# Patient Record
Sex: Female | Born: 1980 | Hispanic: Yes | Marital: Single | Smoking: Never smoker
Health system: Southern US, Community
[De-identification: ages and names within clinical notes are randomized; demographics above are authoritative.]

## PROBLEM LIST (undated history)

## (undated) HISTORY — PX: ANKLE SURGERY: SHX546

## (undated) HISTORY — PX: OTHER SURGICAL HISTORY: SHX169

---
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
## 2001-07-31 ENCOUNTER — Inpatient Hospital Stay (HOSPITAL_COMMUNITY): Admission: AD | Admit: 2001-07-31 | Discharge: 2001-08-02 | Payer: Self-pay | Admitting: Obstetrics

## 2008-09-05 ENCOUNTER — Inpatient Hospital Stay: Payer: Self-pay

## 2015-01-30 LAB — HM PAP SMEAR: HM Pap smear: NEGATIVE

## 2015-05-24 LAB — HM HIV SCREENING LAB: HM HIV Screening: NEGATIVE

## 2017-01-12 ENCOUNTER — Encounter: Payer: Self-pay | Admitting: Emergency Medicine

## 2017-01-12 ENCOUNTER — Emergency Department
Admission: EM | Admit: 2017-01-12 | Discharge: 2017-01-12 | Disposition: A | Discharge status: Home / Self Care | Payer: Self-pay | Attending: Emergency Medicine | Admitting: Emergency Medicine

## 2017-01-12 ENCOUNTER — Emergency Department: Payer: Self-pay

## 2017-01-12 DIAGNOSIS — M25531 Pain in right wrist: Secondary | ICD-10-CM | POA: Insufficient documentation

## 2017-01-12 DIAGNOSIS — M79641 Pain in right hand: Secondary | ICD-10-CM

## 2017-01-12 DIAGNOSIS — Y939 Activity, unspecified: Secondary | ICD-10-CM | POA: Insufficient documentation

## 2017-01-12 DIAGNOSIS — R531 Weakness: Secondary | ICD-10-CM | POA: Insufficient documentation

## 2017-01-12 DIAGNOSIS — W208XXA Other cause of strike by thrown, projected or falling object, initial encounter: Secondary | ICD-10-CM | POA: Insufficient documentation

## 2017-01-12 DIAGNOSIS — Y990 Civilian activity done for income or pay: Secondary | ICD-10-CM | POA: Insufficient documentation

## 2017-01-12 DIAGNOSIS — Y929 Unspecified place or not applicable: Secondary | ICD-10-CM | POA: Insufficient documentation

## 2017-01-12 MED ORDER — MELOXICAM 7.5 MG PO TABS: 7.5000 mg | ORAL_TABLET | Freq: Every day | ORAL | 1 refills | Status: AC

## 2017-01-12 NOTE — ED Triage Notes (Signed)
Last Wednesday while at work, one of the pots that patient was washing hit right wrist. C/O right wrist pain and right hand feeling weaker.
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
## 2017-05-14 ENCOUNTER — Encounter: Payer: Self-pay | Admitting: Emergency Medicine

## 2017-05-14 ENCOUNTER — Emergency Department
Admission: EM | Admit: 2017-05-14 | Discharge: 2017-05-14 | Disposition: A | Discharge status: Home / Self Care | Payer: Self-pay | Attending: Emergency Medicine | Admitting: Emergency Medicine

## 2017-05-14 DIAGNOSIS — M436 Torticollis: Secondary | ICD-10-CM | POA: Insufficient documentation

## 2017-05-14 MED ORDER — MELOXICAM 15 MG PO TABS: 15.0000 mg | ORAL_TABLET | Freq: Every day | ORAL | 0 refills | Status: DC

## 2017-05-14 MED ORDER — CYCLOBENZAPRINE HCL 10 MG PO TABS: 10.0000 mg | ORAL_TABLET | Freq: Three times a day (TID) | ORAL | 0 refills | Status: DC | PRN

## 2017-05-14 NOTE — ED Triage Notes (Signed)
Pt reports turned head last night heard a pop and now has left side neck pain. Pt ambulatory to triage. Pt alert and oriented.

## 2017-05-14 NOTE — ED Provider Notes (Signed)
[facility] Emergency Department Provider Note  ____________________________________________  Time seen: Approximately 4:37 PM  I have reviewed the triage vital signs and the nursing notes.   HISTORY  Chief Complaint Neck Pain    HPI Alejandra Coleman is a 36 y.o. female who presents emergency department complaining of left-sided neck pain. Patient reports that she believes she slept wrong. When she woke up this morning, she felt a "pop" to the left side of her neck and has had neck stiffness and pain to the lateral left neck since. She denies any radicular symptoms. No trauma. No headache or visual changes, chest pain, shortness of breath, abdominal pain, nausea or vomiting. No other complaints at this time.No medications prior to arrival.   History reviewed. No pertinent past medical history.  There are no active problems to display for this patient.   No past surgical history on file.  Prior to Admission medications   Medication Sig Start Date End Date Taking? Authorizing Provider  cyclobenzaprine (FLEXERIL) 10 MG tablet Take 1 tablet (10 mg total) by mouth 3 (three) times daily as needed for muscle spasms. 05/14/17   Nahla Lukin, Delorise Royals, PA-C  meloxicam (MOBIC) 15 MG tablet Take 1 tablet (15 mg total) by mouth daily. 05/14/17   Trimaine Maser, Delorise Royals, PA-C    Allergies Patient has no known allergies.  No family history on file.  Social History Social History  Substance Use Topics  . Smoking status: Not on file  . Smokeless tobacco: Not on file  . Alcohol use Not on file     Review of Systems  Constitutional: No fever/chills Eyes: No visual changes. No discharge ENT: No upper respiratory complaints. Cardiovascular: no chest pain. Respiratory: no cough. No SOB. Gastrointestinal: No abdominal pain.  No nausea, no vomiting. Musculoskeletal: Positive for left-sided neck pain Skin: Negative for rash, abrasions, lacerations,  ecchymosis. Neurological: Negative for headaches, focal weakness or numbness. 10-point ROS otherwise negative.  ____________________________________________   PHYSICAL EXAM:  VITAL SIGNS: ED Triage Vitals  Enc Vitals Group     BP 05/14/17 1601 115/68     Pulse Rate 05/14/17 1601 73     Resp 05/14/17 1601 18     Temp 05/14/17 1601 98.9 F (37.2 C)     Temp Source 05/14/17 1601 Oral     SpO2 05/14/17 1601 97 %     Weight 05/14/17 1559 138 lb (62.6 kg)     Height --      Head Circumference --      Peak Flow --      Pain Score 05/14/17 1559 8     Pain Loc --      Pain Edu? --      Excl. in GC? --      Constitutional: Alert and oriented. Well appearing and in no acute distress. Eyes: Conjunctivae are normal. PERRL. EOMI. Head: Atraumatic. ENT:      Ears:       Nose: No congestion/rhinnorhea.      Mouth/Throat: Mucous membranes are moist.  Neck: No stridor.  No midline cervical spine tenderness to palpation. Mild tenderness to palpation of the trapezius muscle with spasms appreciated. No other tenderness to palpation. Radial pulse intact bilateral upper extremity. Sensation intact and equal all dermatomal distributions bilateral upper extremities.  Cardiovascular: Normal rate, regular rhythm. Normal S1 and S2.  Good peripheral circulation. Respiratory: Normal respiratory effort without tachypnea or retractions. Lungs CTAB. Good air entry to the bases with no decreased or absent breath  sounds. Musculoskeletal: Full range of motion to all extremities. No gross deformities appreciated. Neurologic:  Normal speech and language. No gross focal neurologic deficits are appreciated.  Skin:  Skin is warm, dry and intact. No rash noted. Psychiatric: Mood and affect are normal. Speech and behavior are normal. Patient exhibits appropriate insight and judgement.   ____________________________________________   LABS (all labs ordered are listed, but only abnormal results are  displayed)  Labs Reviewed - No data to display ____________________________________________  EKG   ____________________________________________  RADIOLOGY   No results found.  ____________________________________________    PROCEDURES  Procedure(s) performed:    Procedures    Medications - No data to display   ____________________________________________   INITIAL IMPRESSION / ASSESSMENT AND PLAN / ED COURSE  Pertinent labs & imaging results that were available during my care of the patient were reviewed by me and considered in my medical decision making (see chart for details).  Review of the [place] CSRS was performed in accordance of the [facility] prior to dispensing any controlled drugs.     Patient's diagnosis is consistent with torticollis. History, physical exam are consistent with diagnosis of torticollis. At this time, no indication for labs or imaging. Patient will be discharged home with prescriptions for anti-inflammatory muscle relaxer. Patient is to follow up with primary care as needed or otherwise directed. Patient is given ED precautions to return to the ED for any worsening or new symptoms.     ____________________________________________  FINAL CLINICAL IMPRESSION(S) / ED DIAGNOSES  Final diagnoses:  Torticollis      NEW MEDICATIONS STARTED DURING THIS VISIT:  New Prescriptions   CYCLOBENZAPRINE (FLEXERIL) 10 MG TABLET    Take 1 tablet (10 mg total) by mouth 3 (three) times daily as needed for muscle spasms.   MELOXICAM (MOBIC) 15 MG TABLET    Take 1 tablet (15 mg total) by mouth daily.        This chart was dictated using voice recognition software/Dragon. Despite best efforts to proofread, errors can occur which can change the meaning. Any change was purely unintentional.    Racheal PatchesCuthriell, Eulon Allnutt D, PA-C 05/14/17 1642    Rockne MenghiniNorman, Anne-Caroline, MD 05/14/17 [phone]

## 2017-05-14 NOTE — ED Notes (Signed)
See triage note  Presents with pain to neck  States she woke up with pain yesterday  Felt a pop when turning her neck  Denies any trauma or fever

## 2017-06-14 ENCOUNTER — Encounter: Payer: Self-pay | Admitting: Emergency Medicine

## 2017-07-17 ENCOUNTER — Encounter (HOSPITAL_COMMUNITY): Payer: Self-pay

## 2017-07-17 DIAGNOSIS — Z5321 Procedure and treatment not carried out due to patient leaving prior to being seen by health care provider: Secondary | ICD-10-CM | POA: Insufficient documentation

## 2017-07-17 DIAGNOSIS — R109 Unspecified abdominal pain: Secondary | ICD-10-CM | POA: Insufficient documentation

## 2017-07-17 LAB — URINALYSIS, ROUTINE W REFLEX MICROSCOPIC
BILIRUBIN URINE: NEGATIVE
GLUCOSE, UA: NEGATIVE mg/dL
Hgb urine dipstick: NEGATIVE
KETONES UR: NEGATIVE mg/dL
Nitrite: NEGATIVE
PH: 6 (ref 5.0–8.0)
Protein, ur: 30 mg/dL — AB
Specific Gravity, Urine: 1.025 (ref 1.005–1.030)

## 2017-07-17 LAB — COMPREHENSIVE METABOLIC PANEL
ALT: 29 U/L (ref 14–54)
AST: 24 U/L (ref 15–41)
Albumin: 4.1 g/dL (ref 3.5–5.0)
Alkaline Phosphatase: 59 U/L (ref 38–126)
Anion gap: 8 (ref 5–15)
BUN: 12 mg/dL (ref 6–20)
CALCIUM: 9 mg/dL (ref 8.9–10.3)
CO2: 21 mmol/L — ABNORMAL LOW (ref 22–32)
CREATININE: 0.72 mg/dL (ref 0.44–1.00)
Chloride: 105 mmol/L (ref 101–111)
GFR calc non Af Amer: >60 mL/min (ref >60)
Glucose, Bld: 111 mg/dL — ABNORMAL HIGH (ref 65–99)
Potassium: 3.8 mmol/L (ref 3.5–5.1)
Sodium: 134 mmol/L — ABNORMAL LOW (ref 135–145)
Total Bilirubin: 0.7 mg/dL (ref 0.3–1.2)
Total Protein: 6.9 g/dL (ref 6.5–8.1)

## 2017-07-17 LAB — CBC
HCT: 39.7 % (ref 36.0–46.0)
Hemoglobin: 14.1 g/dL (ref 12.0–15.0)
MCH: 31.3 pg (ref 26.0–34.0)
MCHC: 35.5 g/dL (ref 30.0–36.0)
MCV: 88.2 fL (ref 78.0–100.0)
PLATELETS: 240 10*3/uL (ref 150–400)
RBC: 4.5 MIL/uL (ref 3.87–5.11)
RDW: 12.3 % (ref 11.5–15.5)
WBC: 9.6 10*3/uL (ref 4.0–10.5)

## 2017-07-17 LAB — LIPASE, BLOOD: LIPASE: 25 U/L (ref 11–51)

## 2017-07-17 LAB — I-STAT BETA HCG BLOOD, ED (MC, WL, AP ONLY): I-stat hCG, quantitative: 15.8 m[IU]/mL — ABNORMAL HIGH (ref <5)

## 2017-07-17 NOTE — ED Triage Notes (Signed)
Pt endorses Left sided abd pain x 1 week with nausea. Pt states that she thinks she may be pregnant because her menstrual cycle hasn't started yet. Also endorsing hematuria. Denies vomiting or diarrhea. VSS.

## 2017-07-18 ENCOUNTER — Emergency Department (HOSPITAL_COMMUNITY): Payer: Self-pay

## 2017-07-18 ENCOUNTER — Emergency Department (HOSPITAL_COMMUNITY)
Admission: EM | Admit: 2017-07-18 | Discharge: 2017-07-18 | Disposition: A | Discharge status: Home / Self Care | Payer: Self-pay | Attending: Emergency Medicine | Admitting: Emergency Medicine

## 2017-07-18 ENCOUNTER — Encounter (HOSPITAL_COMMUNITY): Payer: Self-pay | Admitting: Emergency Medicine

## 2017-07-18 DIAGNOSIS — Z79899 Other long term (current) drug therapy: Secondary | ICD-10-CM | POA: Insufficient documentation

## 2017-07-18 DIAGNOSIS — K5900 Constipation, unspecified: Secondary | ICD-10-CM | POA: Insufficient documentation

## 2017-07-18 DIAGNOSIS — F17210 Nicotine dependence, cigarettes, uncomplicated: Secondary | ICD-10-CM | POA: Insufficient documentation

## 2017-07-18 DIAGNOSIS — F17228 Nicotine dependence, chewing tobacco, with other nicotine-induced disorders: Secondary | ICD-10-CM | POA: Insufficient documentation

## 2017-07-18 DIAGNOSIS — R109 Unspecified abdominal pain: Secondary | ICD-10-CM | POA: Insufficient documentation

## 2017-07-18 LAB — COMPREHENSIVE METABOLIC PANEL
ALK PHOS: 52 U/L (ref 38–126)
ALT: 25 U/L (ref 14–54)
AST: 16 U/L (ref 15–41)
Albumin: 3.8 g/dL (ref 3.5–5.0)
Anion gap: 6 (ref 5–15)
BUN: 9 mg/dL (ref 6–20)
CALCIUM: 8.6 mg/dL — AB (ref 8.9–10.3)
CO2: 23 mmol/L (ref 22–32)
CREATININE: 0.52 mg/dL (ref 0.44–1.00)
Chloride: 108 mmol/L (ref 101–111)
Glucose, Bld: 95 mg/dL (ref 65–99)
Potassium: 3.8 mmol/L (ref 3.5–5.1)
Sodium: 137 mmol/L (ref 135–145)
Total Bilirubin: 0.8 mg/dL (ref 0.3–1.2)
Total Protein: 6.6 g/dL (ref 6.5–8.1)

## 2017-07-18 LAB — POC URINE PREG, ED: Preg Test, Ur: NEGATIVE

## 2017-07-18 LAB — CBC WITH DIFFERENTIAL/PLATELET
BASOS ABS: 0 10*3/uL (ref 0.0–0.1)
Basophils Relative: 0 %
Eosinophils Absolute: 0.1 10*3/uL (ref 0.0–0.7)
Eosinophils Relative: 1 %
HEMATOCRIT: 38.9 % (ref 36.0–46.0)
Hemoglobin: 13.3 g/dL (ref 12.0–15.0)
LYMPHS PCT: 36 %
Lymphs Abs: 2.5 10*3/uL (ref 0.7–4.0)
MCH: 30.6 pg (ref 26.0–34.0)
MCHC: 34.2 g/dL (ref 30.0–36.0)
MCV: 89.4 fL (ref 78.0–100.0)
MONOS PCT: 5 %
Monocytes Absolute: 0.3 10*3/uL (ref 0.1–1.0)
Neutro Abs: 3.9 10*3/uL (ref 1.7–7.7)
Neutrophils Relative %: 58 %
Platelets: 216 10*3/uL (ref 150–400)
RBC: 4.35 MIL/uL (ref 3.87–5.11)
RDW: 12.4 % (ref 11.5–15.5)
WBC: 6.8 10*3/uL (ref 4.0–10.5)

## 2017-07-18 LAB — URINALYSIS, ROUTINE W REFLEX MICROSCOPIC
Bilirubin Urine: NEGATIVE
GLUCOSE, UA: NEGATIVE mg/dL
Hgb urine dipstick: NEGATIVE
Ketones, ur: NEGATIVE mg/dL
NITRITE: NEGATIVE
Protein, ur: NEGATIVE mg/dL
SPECIFIC GRAVITY, URINE: 1.018 (ref 1.005–1.030)
pH: 7 (ref 5.0–8.0)

## 2017-07-18 LAB — LIPASE, BLOOD: LIPASE: 22 U/L (ref 11–51)

## 2017-07-18 MED ORDER — POLYETHYLENE GLYCOL 3350 17 G PO PACK: 17.0000 g | PACK | Freq: Every day | ORAL | 0 refills | Status: DC

## 2017-07-18 MED ORDER — OMEPRAZOLE 20 MG PO CPDR: 20.0000 mg | DELAYED_RELEASE_CAPSULE | Freq: Every day | ORAL | 0 refills | Status: DC

## 2017-07-18 NOTE — ED Provider Notes (Signed)
[facility] EMERGENCY DEPARTMENT Provider Note   CSN: [id number] Arrival date & time: 07/18/17  1246     History   Chief Complaint Chief Complaint  Patient presents with  . Abdominal Pain  . Hematuria    HPI Alejandra Coleman is a 36 y.o. female.  HPI Patient presents with abdominal pain.  Has had it for the last week.  It is in her left upper abdomen.  No vomiting.  Has had some constipation.  No fevers or chills.  Has not really pains like this before.  She speaks Spanish primarily and translator service was used.  No blood in the stool.  Pain does not appear to associate with eating.  It has come and gone.  States she has at times had blood in her urine also. History reviewed. No pertinent past medical history.  There are no active problems to display for this patient.   Past Surgical History:  Procedure Laterality Date  . ANKLE SURGERY      OB History    No data available       Home Medications    Prior to Admission medications   Medication Sig Start Date End Date Taking? Authorizing Provider  ibuprofen (ADVIL,MOTRIN) 200 MG tablet Take 400 mg by mouth every 6 (six) hours as needed for moderate pain.   Yes [provider]  cyclobenzaprine (FLEXERIL) 10 MG tablet Take 1 tablet (10 mg total) by mouth 3 (three) times daily as needed for muscle spasms. Patient not taking: Reported on 07/18/2017 05/14/17   Cuthriell, Delorise Royals, PA-C  meloxicam (MOBIC) 15 MG tablet Take 1 tablet (15 mg total) by mouth daily. Patient not taking: Reported on 07/18/2017 05/14/17   Cuthriell, Delorise Royals, PA-C  omeprazole (PRILOSEC) 20 MG capsule Take 1 capsule (20 mg total) by mouth daily. 07/18/17   Benjiman Core, MD  polyethylene glycol [facility] / GLYCOLAX) packet Take 17 g by mouth daily. 07/18/17   Benjiman Core, MD    Family History No family history on file.  Social History Social History  Substance Use Topics  . Smoking status: Current  Some Day Smoker    Types: Cigarettes  . Smokeless tobacco: Current User  . Alcohol use Yes     Comment: occ     Allergies   Patient has no known allergies.   Review of Systems Review of Systems  Constitutional: Negative for appetite change.  HENT: Negative for congestion.   Respiratory: Negative for shortness of breath.   Cardiovascular: Negative for chest pain.  Gastrointestinal: Positive for abdominal pain and constipation.  Genitourinary: Positive for hematuria. Negative for flank pain.  Musculoskeletal: Negative for back pain.  Skin: Negative for rash.  Neurological: Negative for numbness.  Psychiatric/Behavioral: Negative for confusion.     Physical Exam Updated Vital Signs BP 131/81 (BP Location: Right Arm)   Pulse 89   Temp 98.4 F (36.9 C) (Oral)   Resp 18   Ht 5\' 3"  (1.6 m)   Wt 63.5 kg (140 lb)   LMP 06/09/2017 Comment: Neg Preg Test  SpO2 98%   BMI 24.80 kg/m   Physical Exam  Constitutional: She appears well-developed.  HENT:  Head: Atraumatic.  Eyes: EOM are normal.  Neck: Neck supple.  Cardiovascular: Normal rate.   Pulmonary/Chest: Effort normal.  Abdominal: There is tenderness.  Mild left upper quadrant tenderness without rebound or guarding.  Musculoskeletal: She exhibits no edema.  Neurological: She is alert.  Skin: Skin is warm. Capillary  refill takes less than 2 seconds.  Psychiatric: She has a normal mood and affect.     ED Treatments / Results  Labs (all labs ordered are listed, but only abnormal results are displayed) Labs Reviewed  URINALYSIS, ROUTINE W REFLEX MICROSCOPIC - Abnormal; Notable for the following:       Result Value   APPearance HAZY (*)    Leukocytes, UA TRACE (*)    Bacteria, UA RARE (*)    Squamous Epithelial / LPF 0-5 (*)    All other components within normal limits  COMPREHENSIVE METABOLIC PANEL - Abnormal; Notable for the following:    Calcium 8.6 (*)    All other components within normal limits  LIPASE,  BLOOD  CBC WITH DIFFERENTIAL/PLATELET  POC URINE PREG, ED    EKG  EKG Interpretation None       Radiology Dg Abd 2 Views  Result Date: 07/18/2017 CLINICAL DATA:  Left abdominal pain for the past week. EXAM: ABDOMEN - 2 VIEW COMPARISON:  None. FINDINGS: Normal bowel gas pattern without free peritoneal air. Bilateral pelvic calcifications, greater on the right. Normal appearing bones. IMPRESSION: 1. No acute abnormality. 2. Bilateral pelvic phleboliths and additional nonspecific right pelvic calcifications. Electronically Signed   By: Beckie SaltsSteven  Reid M.D.   On: 07/18/2017 16:02    Procedures Procedures (including critical care time)  Medications Ordered in ED Medications - No data to display   Initial Impression / Assessment and Plan / ED Course  I have reviewed the triage vital signs and the nursing notes.  Pertinent labs & imaging results that were available during my care of the patient were reviewed by me and considered in my medical decision making (see chart for details).     Patient with upper abdominal pain some constipation.  Labs reassuring.  Imaging reassuring.  No hematuria on our workup.  With the mild left epigastric pain will give some Prilosec at home.  Also some MiraLAX for the constipation she has had.  Will need to follow-up as needed as an outpatient.  Patient is not pregnant has had no other GU symptoms.  Final Clinical Impressions(s) / ED Diagnoses   Final diagnoses:  Abdominal pain    New Prescriptions Discharge Medication List as of 07/18/2017  4:30 PM    START taking these medications   Details  omeprazole (PRILOSEC) 20 MG capsule Take 1 capsule (20 mg total) by mouth daily., Starting Sat 07/18/2017, Print    polyethylene glycol (MIRALAX / GLYCOLAX) packet Take 17 g by mouth daily., Starting Sat 07/18/2017, Print         Benjiman CorePickering, Keegan Bensch, MD 07/18/17 [phone]

## 2017-07-18 NOTE — ED Notes (Signed)
No answer in waiting room 

## 2017-07-18 NOTE — ED Triage Notes (Signed)
Pt/daughter Alejandra Coleman/translator stated, sh e has stomach ache for one week.

## 2017-07-18 NOTE — ED Notes (Signed)
Pt returned from xray

## 2017-07-18 NOTE — ED Notes (Signed)
Patient transported to X-ray 

## 2017-07-18 NOTE — ED Notes (Signed)
ED Provider at bedside. 

## 2017-07-18 NOTE — ED Notes (Addendum)
No answer in waiting room per Zella Ballobin, EMT

## 2017-07-18 NOTE — ED Notes (Signed)
Ipad translator used for communication

## 2017-07-24 ENCOUNTER — Other Ambulatory Visit: Payer: Self-pay

## 2017-07-24 ENCOUNTER — Emergency Department (HOSPITAL_COMMUNITY)
Admission: EM | Admit: 2017-07-24 | Discharge: 2017-07-24 | Disposition: A | Discharge status: Home / Self Care | Payer: Self-pay | Attending: Emergency Medicine | Admitting: Emergency Medicine

## 2017-07-24 DIAGNOSIS — Z5321 Procedure and treatment not carried out due to patient leaving prior to being seen by health care provider: Secondary | ICD-10-CM | POA: Insufficient documentation

## 2017-07-24 LAB — POC URINE PREG, ED: Preg Test, Ur: POSITIVE — AB

## 2017-07-24 LAB — URINALYSIS, ROUTINE W REFLEX MICROSCOPIC
Bacteria, UA: NONE SEEN
Bilirubin Urine: NEGATIVE
GLUCOSE, UA: NEGATIVE mg/dL
KETONES UR: NEGATIVE mg/dL
LEUKOCYTES UA: NEGATIVE
Nitrite: NEGATIVE
PH: 7 (ref 5.0–8.0)
PROTEIN: NEGATIVE mg/dL
Specific Gravity, Urine: 1.01 (ref 1.005–1.030)

## 2017-07-24 NOTE — ED Notes (Signed)
Patient was called for 3rd time no answer.

## 2017-07-24 NOTE — ED Notes (Signed)
Called patient no answer.

## 2017-07-24 NOTE — ED Notes (Signed)
Patient called multiple times for room, no answer.

## 2017-07-24 NOTE — ED Notes (Signed)
Pelvic cart @ bedside.  

## 2017-07-24 NOTE — ED Triage Notes (Signed)
Pt reports she started having vaginal bleeding this am at 0600 after last period was 05/31/17 and pt thought she was pregnant. Pt concerned due to fact that menstrual period was skipped last month and now early. Pt has lower abd cramping.

## 2017-07-27 ENCOUNTER — Emergency Department
Admission: EM | Admit: 2017-07-27 | Discharge: 2017-07-27 | Disposition: A | Discharge status: Home / Self Care | Payer: Self-pay | Attending: Emergency Medicine | Admitting: Emergency Medicine

## 2017-07-27 ENCOUNTER — Emergency Department: Payer: Self-pay

## 2017-07-27 ENCOUNTER — Other Ambulatory Visit: Payer: Self-pay

## 2017-07-27 DIAGNOSIS — Z3401 Encounter for supervision of normal first pregnancy, first trimester: Secondary | ICD-10-CM | POA: Insufficient documentation

## 2017-07-27 DIAGNOSIS — F17210 Nicotine dependence, cigarettes, uncomplicated: Secondary | ICD-10-CM | POA: Insufficient documentation

## 2017-07-27 DIAGNOSIS — O208 Other hemorrhage in early pregnancy: Secondary | ICD-10-CM | POA: Insufficient documentation

## 2017-07-27 DIAGNOSIS — Z3A01 Less than 8 weeks gestation of pregnancy: Secondary | ICD-10-CM | POA: Insufficient documentation

## 2017-07-27 DIAGNOSIS — O4690 Antepartum hemorrhage, unspecified, unspecified trimester: Secondary | ICD-10-CM

## 2017-07-27 DIAGNOSIS — R102 Pelvic and perineal pain: Secondary | ICD-10-CM | POA: Insufficient documentation

## 2017-07-27 DIAGNOSIS — O9989 Other specified diseases and conditions complicating pregnancy, childbirth and the puerperium: Secondary | ICD-10-CM | POA: Insufficient documentation

## 2017-07-27 DIAGNOSIS — N939 Abnormal uterine and vaginal bleeding, unspecified: Secondary | ICD-10-CM

## 2017-07-27 LAB — HCG, QUANTITATIVE, PREGNANCY: hCG, Beta Chain, Quant, S: 2473 m[IU]/mL — ABNORMAL HIGH (ref <5)

## 2017-07-27 LAB — BASIC METABOLIC PANEL
Anion gap: 6 (ref 5–15)
BUN: 11 mg/dL (ref 6–20)
CHLORIDE: 107 mmol/L (ref 101–111)
CO2: 24 mmol/L (ref 22–32)
CREATININE: 0.53 mg/dL (ref 0.44–1.00)
Calcium: 8.7 mg/dL — ABNORMAL LOW (ref 8.9–10.3)
GFR calc Af Amer: >60 mL/min (ref >60)
GFR calc non Af Amer: >60 mL/min (ref >60)
GLUCOSE: 78 mg/dL (ref 65–99)
Potassium: 3.8 mmol/L (ref 3.5–5.1)
Sodium: 137 mmol/L (ref 135–145)

## 2017-07-27 LAB — CHLAMYDIA/NGC RT PCR (ARMC ONLY)
CHLAMYDIA TR: DETECTED — AB
N GONORRHOEAE: NOT DETECTED

## 2017-07-27 LAB — WET PREP, GENITAL
SPERM: NONE SEEN
Trich, Wet Prep: NONE SEEN
Yeast Wet Prep HPF POC: NONE SEEN

## 2017-07-27 LAB — CBC
HCT: 39.6 % (ref 35.0–47.0)
HEMOGLOBIN: 13.5 g/dL (ref 12.0–16.0)
MCH: 30.9 pg (ref 26.0–34.0)
MCHC: 34.1 g/dL (ref 32.0–36.0)
MCV: 90.7 fL (ref 80.0–100.0)
Platelets: 235 10*3/uL (ref 150–440)
RBC: 4.37 MIL/uL (ref 3.80–5.20)
RDW: 12.9 % (ref 11.5–14.5)
WBC: 6.3 10*3/uL (ref 3.6–11.0)

## 2017-07-27 LAB — POCT PREGNANCY, URINE: Preg Test, Ur: POSITIVE — AB

## 2017-07-27 LAB — ABO/RH: ABO/RH(D): A POS

## 2017-07-27 MED ORDER — PRENATAL VITAMINS 0.8 MG PO TABS: 1.0000 | ORAL_TABLET | Freq: Every day | ORAL | 0 refills | Status: DC

## 2017-07-27 NOTE — ED Triage Notes (Signed)
Pt c/o lower abdominal cramping and intermittent vaginal bleeding X 2 days. Pt states she is pregnant, first day of last period 9/20. Has not had first OBGYN appt  Yet. Pt alert and oriented X4, active, cooperative, pt in NAD. RR even and unlabored, color WNL.

## 2017-07-27 NOTE — Discharge Instructions (Signed)
Please make a follow-up appointment with the OB/GYN in 2 days for repeat blood work and examination.  Return to the emergency department if you develop severe pain, lightheadedness or fainting, increased bleeding, fever, or any other symptoms concerning to you.

## 2017-07-27 NOTE — ED Provider Notes (Signed)
[facility] Emergency Department Provider Note  ____________________________________________  Time seen: Approximately 4:05 PM  I have reviewed the triage vital signs and the nursing notes.   HISTORY  Chief Complaint Vaginal Bleeding    HPI Alejandra Coleman is a 36 y.o. female G3P2 w/ LMP 06/04/17, not yet established with prenatal care, presenting for vaginal spotting for 2d.  She reports that since yesterday, she has had some mild blood streaks when she wipes only.  She has had some mild pelvic cramping, but does not have any at this time.  No lightheadedness, shortness of breath, fever or chills.  History reviewed. No pertinent past medical history.  There are no active problems to display for this patient.   Past Surgical History:  Procedure Laterality Date  . ANKLE SURGERY      Current Outpatient Rx  . Order #: [id number] Class: Print  . Order #: [id number] Class: Historical Med  . Order #: [id number] Class: Print  . Order #: [id number] Class: Print  . Order #: [id number] Class: Print  . Order #: [id number] Class: Print    Allergies Patient has no known allergies.  No family history on file.  Social History Social History   Tobacco Use  . Smoking status: Current Some Day Smoker    Types: Cigarettes  . Smokeless tobacco: Current User  Substance Use Topics  . Alcohol use: Yes    Comment: occ  . Drug use: No    Review of Systems Constitutional: No fever/chills.  No lightheadedness or syncope.  Eyes: No visual changes. ENT: No sore throat. No congestion or rhinorrhea. Cardiovascular: Denies palpitations. Respiratory: Denies shortness of breath.   Gastrointestinal: No abdominal pain.  No nausea, no vomiting.  No diarrhea.  No constipation. Genitourinary: Negative for dysuria.  Positive vaginal bleeding.  Mild pelvic cramping. Musculoskeletal: Negative for back pain. Skin: Negative for rash. Neurological: Negative for headaches. No  focal numbness, tingling or weakness.     ____________________________________________   PHYSICAL EXAM:  VITAL SIGNS: ED Triage Vitals  Enc Vitals Group     BP 07/27/17 1125 113/84     Pulse Rate 07/27/17 1125 83     Resp 07/27/17 1125 18     Temp 07/27/17 1125 98.9 F (37.2 C)     Temp Source 07/27/17 1125 Oral     SpO2 07/27/17 1125 100 %     Weight 07/27/17 1125 137 lb (62.1 kg)     Height 07/27/17 1125 5\' 3"  (1.6 m)     Head Circumference --      Peak Flow --      Pain Score 07/27/17 1124 2     Pain Loc --      Pain Edu? --      Excl. in GC? --     Constitutional: Alert and oriented. Well appearing and in no acute distress. Answers questions appropriately. Eyes: Conjunctivae are normal.  EOMI. No scleral icterus. Head: Atraumatic. Nose: No congestion/rhinnorhea. Mouth/Throat: Mucous membranes are moist.  Neck: No stridor.  Supple.  No JVD. Cardiovascular: Normal rate, regular rhythm. No murmurs, rubs or gallops.  Respiratory: Normal respiratory effort.  No accessory muscle use or retractions. Lungs CTAB.  No wheezes, rales or ronchi. Gastrointestinal: Soft, nontender and nondistended.  No guarding or rebound.  No peritoneal signs. Genitourinary: Normal-appearing external genitalia without lesions. Normal vaginal exam with minimal blood tinged whitish discharge, normal-appearing cervix, normal vaginal wall tissue. Bimanual exam is negative for CMT, adnexal tenderness to palpation, no palpable masses. External os  is open. Musculoskeletal: No LE edema.  Neurologic:  A&Ox3.  Speech is clear.  Face and smile are symmetric.  EOMI.  Moves all extremities well. Skin:  Skin is warm, dry and intact. No rash noted. Psychiatric: Mood and affect are normal. Speech and behavior are normal.  Normal judgement  ____________________________________________   LABS (all labs ordered are listed, but only abnormal results are displayed)  Labs Reviewed  HCG, QUANTITATIVE, PREGNANCY -  Abnormal; Notable for the following components:      Result Value   hCG, Beta Chain, Quant, S 2,473 (*)    All other components within normal limits  BASIC METABOLIC PANEL - Abnormal; Notable for the following components:   Calcium 8.7 (*)    All other components within normal limits  POCT PREGNANCY, URINE - Abnormal; Notable for the following components:   Preg Test, Ur POSITIVE (*)    All other components within normal limits  WET PREP, GENITAL  CHLAMYDIA/NGC RT PCR ([facility] ONLY)  CBC  POC URINE PREG, ED  ABO/RH   ____________________________________________  EKG  Not indicated ____________________________________________  RADIOLOGY  [place] Ob Comp Less 14 Wks  Result Date: 07/27/2017 CLINICAL DATA:  Vaginal bleeding for 2 days. Patient is pregnant, beta HCG level two thousand four hundred seventy-three. Patient is 7 weeks and 4 days pregnant based on her last menstrual period. EXAM: OBSTETRIC <14 WK US AND TRANSVAGINAL OB US TECHNIQUE: Both transabdominal and transvaginal ultrasound examinations were performed for complete evaluation of the gestation as well as the maternal uterus, adnexal regions, and pelvic cul-de-sac. Transvaginal technique was performed to assess early pregnancy. COMPARISON:  None. FINDINGS: Intrauterine gestational sac: Single Yolk sac:  Not Visualized. Embryo:  Not Visualized. Cardiac Activity: Not Visualized. MSD: 5.3  mm   5 w   2  d Subchorionic hemorrhage:  None visualized. Maternal uterus/adnexae: No uterine masses. Cervix is unremarkable. Normal right ovary visualized. Left ovary not visualized. No adnexal masses. No pelvic free fluid. IMPRESSION: 1. Probable early intrauterine gestational sac, but no yolk sac, fetal pole, or cardiac activity yet visualized. Recommend follow-up quantitative B-HCG levels and follow-up US in 14 days to assess viability. This recommendation follows SRU consensus guidelines: Diagnostic Criteria for Nonviable Pregnancy Early  in the First Trimester. Malva Limes Engl J Med 2013; 409:8119-14; 369:1443-51. 2. Exam otherwise unremarkable. Electronically Signed   By: Amie Portlandavid  Ormond M.D.   On: 07/27/2017 15:11   [place] Ob Transvaginal  Result Date: 07/27/2017 CLINICAL DATA:  Vaginal bleeding for 2 days. Patient is pregnant, beta HCG level two thousand four hundred seventy-three. Patient is 7 weeks and 4 days pregnant based on her last menstrual period. EXAM: OBSTETRIC <14 WK US AND TRANSVAGINAL OB US TECHNIQUE: Both transabdominal and transvaginal ultrasound examinations were performed for complete evaluation of the gestation as well as the maternal uterus, adnexal regions, and pelvic cul-de-sac. Transvaginal technique was performed to assess early pregnancy. COMPARISON:  None. FINDINGS: Intrauterine gestational sac: Single Yolk sac:  Not Visualized. Embryo:  Not Visualized. Cardiac Activity: Not Visualized. MSD: 5.3  mm   5 w   2  d Subchorionic hemorrhage:  None visualized. Maternal uterus/adnexae: No uterine masses. Cervix is unremarkable. Normal right ovary visualized. Left ovary not visualized. No adnexal masses. No pelvic free fluid. IMPRESSION: 1. Probable early intrauterine gestational sac, but no yolk sac, fetal pole, or cardiac activity yet visualized. Recommend follow-up quantitative B-HCG levels and follow-up US in 14 days to assess viability. This recommendation follows SRU consensus guidelines: Diagnostic Criteria for  Nonviable Pregnancy Early in the First Trimester. Malva Limes Engl J Med 2013; [id number]; 369:1443-51. 2. Exam otherwise unremarkable. Electronically Signed   By: Amie Portlandavid  Ormond M.D.   On: 07/27/2017 15:11    ____________________________________________   PROCEDURES  Procedure(s) performed: None  Procedures  Critical Care performed: No ____________________________________________   INITIAL IMPRESSION / ASSESSMENT AND PLAN / ED COURSE  Pertinent labs & imaging results that were available during my care of the patient were reviewed by me and  considered in my medical decision making (see chart for details).  36 y.o. G3P2 with LMP of 06/04/17 presenting with vaginal spotting.  Overall, the patient is hemodynamically stable and has no evidence of significant anemia or blood loss.  She has an hCG over 2000, and an ultrasound which shows probable early gestational sac without fetal pole, yolk sac, or cardiac activity; no subchorionic hemorrhage.  On my examination, the patient does have an open external loss, and I am concerned about threatened abortion versus first trimester bleeding.  It is unlikely that the patient has a heterotopic ectopic pregnancy.  I have sent a wet prep and cultures to the lab, which her OB/GYN can follow-up.  Her Rh type is positive so Rhogam is not indicated. At this time, the patient Is stable for discharge and will [facility].  I will discharge her home with a prescription for prenatal vitamins, she has not yet started these.  I have spoken to the physician on-call, Dr. Ranae Plumberhelsea Ward, for the group who is aware.  I gave the patient ectopic precautions, bleeding and return precautions, and follow-up instructions. ____________________________________________  FINAL CLINICAL IMPRESSION(S) / ED DIAGNOSES  Final diagnoses:  Vaginal bleeding in pregnancy  Pelvic cramping         NEW MEDICATIONS STARTED DURING THIS VISIT:  This SmartLink is deprecated. Use AVSMEDLIST instead to display the medication list for a patient.    Rockne MenghiniNorman, Anne-Caroline, MD 07/27/17 1620

## 2017-07-28 ENCOUNTER — Telehealth: Payer: Self-pay | Admitting: Emergency Medicine

## 2017-07-28 NOTE — Telephone Encounter (Signed)
Called patient to inform of positive chlamydia test and need for treatment.  Will call in azithromycin 1 gram po x 1 for patient --per dr Lenard Lancepaduchowski.  Via [facility] interpretter--phone went to voicemail and she has left a message asking patient to call me.

## 2020-03-12 ENCOUNTER — Ambulatory Visit: Payer: Self-pay

## 2020-03-21 ENCOUNTER — Other Ambulatory Visit: Payer: Self-pay

## 2020-03-21 ENCOUNTER — Encounter: Payer: Self-pay | Admitting: Family Medicine

## 2020-03-21 ENCOUNTER — Ambulatory Visit (LOCAL_COMMUNITY_HEALTH_CENTER): Payer: Self-pay | Admitting: Family Medicine

## 2020-03-21 VITALS — BP 127/80 | Ht 61.0 in | Wt 150.0 lb

## 2020-03-21 DIAGNOSIS — Z30013 Encounter for initial prescription of injectable contraceptive: Secondary | ICD-10-CM

## 2020-03-21 DIAGNOSIS — Z3009 Encounter for other general counseling and advice on contraception: Secondary | ICD-10-CM

## 2020-03-21 LAB — PREGNANCY, URINE: Preg Test, Ur: NEGATIVE

## 2020-03-21 MED ORDER — MEDROXYPROGESTERONE ACETATE 150 MG/ML IM SUSP
150.0000 mg | INTRAMUSCULAR | Status: AC
  Administered 2020-03-21: 16:00:00 150 mg via INTRAMUSCULAR

## 2020-03-21 NOTE — Progress Notes (Signed)
PT negative. Patient declined bloodwork today, although her consent indicated that she did want bloodwork. Patient initialed and changed consent. Depo consent signed and Depo given, right deltoid, tolerated well, next Depo card given. ROI for last PE and last Pap fax[facility] to [facility] in [place], [place]. Patient interested in covid vaccine, given covid vaccine card to schedule.Burt Knack, RN

## 2020-03-21 NOTE — Progress Notes (Signed)
Family Planning Visit- Initial Visit  Subjective:  Alejandra Coleman is a 39 y.o.  G1P0   being seen today for an initial well woman visit and to discuss family planning options.  She is currently using condoms for pregnancy prevention. Patient reports she does not want a pregnancy in the next year.  Patient has the following medical conditions does not have a problem list on file.  Chief Complaint  Patient presents with  . Annual Exam  . Contraception    depo    Patient reports that she had her annual women's exam and pap @ [facility] in [place].  States that she hasn't been sexually active x 2 years until recently.  She and partner always use condoms with sex-denies unprotected sex with this partner.  She would like to start Depo.  LMP 02/23/2020.  Patient denies concerns today.  Body mass index is 28.34 kg/m. - Patient is eligible for diabetes screening based on BMI and age >29?  not applicable HA1C ordered? not applicable  Patient reports 1 of partners in last year. Desires STI screening?  No  Has patient been screened once for HCV in the past?  No  No results found for: HCVAB  Does the patient have current of drug use, have a partner with drug use, and/or has been incarcerated since last result? No  If yes-- Screen for HCV through [facility]   Does the patient meet criteria for HBV testing? No  Criteria:  -Household, sexual or needle sharing contact with HBV -History of drug use -HIV positive -Those with known Hep C   Health Maintenance Due  Topic Date Due  . Hepatitis C Screening  Never done  . COVID-19 Vaccine (1) Never done  . TETANUS/TDAP  Never done  . PAP SMEAR-Modifier  01/30/2020    Review of Systems  All other systems reviewed and are negative.   The following portions of the patient's history were reviewed and updated as appropriate: allergies, current medications, past family history, past medical history, past social history,  past surgical history and problem list. Problem list updated.   See flowsheet for other program required questions.  Objective:   Vitals:   03/21/20 1431  BP: 127/80  Weight: 150 lb (68 kg)  Height: 5\' 1"  (1.549 m)    Physical Exam PE deferred- request records from [facility].   Assessment and Plan:  Alejandra Coleman is a 39 y.o. female presenting to the [facility] Department for an initial well woman exam/family planning visit  Contraception counseling: Reviewed all forms of birth control options in the tiered based approach. available including abstinence; over the counter/barrier methods; hormonal contraceptive medication including pill, patch, ring, injection,contraceptive implant, ECP; hormonal and nonhormonal IUDs; permanent sterilization options including vasectomy and the various tubal sterilization modalities. Risks, benefits, and typical effectiveness rates were reviewed.  Questions were answered.  Written information was also given to the patient to review.  Patient desires Depo, this was prescribed for patient. She will follow up in  11-13 weeks for surveillance.  She was told to call with any further questions, or with any concerns about this method of contraception.  Emphasized use of condoms 100% of the time for STI prevention.  Patient was not an ECP candidate.  1. Family planning Request records from [facility].  Client to  sign ROI.    2. Initiation of Depo Provera  - Pregnancy, urine- negative results - medroxyPROGESTERone (DEPO-PROVERA) injection 150  mg q 11-13 weeks x 2 doses -Co. To use condoms x 2 weeks for back-up     No follow-ups on file.  No future appointments.  Larene Pickett, FNP

## 2020-05-03 ENCOUNTER — Other Ambulatory Visit: Payer: Self-pay

## 2020-05-03 ENCOUNTER — Ambulatory Visit: Payer: Self-pay | Admitting: Advanced Practice Midwife

## 2020-05-03 DIAGNOSIS — Z720 Tobacco use: Secondary | ICD-10-CM | POA: Insufficient documentation

## 2020-05-03 DIAGNOSIS — N760 Acute vaginitis: Secondary | ICD-10-CM

## 2020-05-03 DIAGNOSIS — E663 Overweight: Secondary | ICD-10-CM | POA: Insufficient documentation

## 2020-05-03 DIAGNOSIS — Z113 Encounter for screening for infections with a predominantly sexual mode of transmission: Secondary | ICD-10-CM

## 2020-05-03 DIAGNOSIS — B9689 Other specified bacterial agents as the cause of diseases classified elsewhere: Secondary | ICD-10-CM

## 2020-05-03 MED ORDER — METRONIDAZOLE 500 MG PO TABS: 500.0000 mg | ORAL_TABLET | Freq: Two times a day (BID) | ORAL | 0 refills | Status: AC

## 2020-05-03 NOTE — Progress Notes (Signed)
Here today for STD screening. Accepts bloodwork. Anterrio Mccleery, RN ° °

## 2020-05-03 NOTE — Progress Notes (Signed)
[facility] Department STI clinic/screening visit  Subjective:  Alejandra Coleman is a 39 y.o. SHF G2P2 vaper  female being seen today for an STI screening visit. The patient reports they do have symptoms.  Patient reports that they do not desire a pregnancy in the next year.   They reported they are not interested in discussing contraception today.  No LMP recorded. Patient has had an injection.   Patient has the following medical conditions:  There are no problems to display for this patient.   Chief Complaint  Patient presents with   SEXUALLY TRANSMITTED DISEASE    Screening    HPI  Patient reports c/o internal vaginal itching x 1 week.  Last sex 04/29/20 without condom; with current partner last 3 mo.  Last vaped 03/2020.  Last ETOH 04/07/20.    Last HIV test per patient/review of record was 05/24/15 Patient reports last pap was 2020 (01/30/15 neg HPV neg)  See flowsheet for further details and programmatic requirements.    The following portions of the patient's history were reviewed and updated as appropriate: allergies, current medications, past medical history, past social history, past surgical history and problem list.  Objective:  There were no vitals filed for this visit.  Physical Exam Vitals and nursing note reviewed.  Constitutional:      Appearance: Normal appearance.  HENT:     Head: Normocephalic and atraumatic.     Mouth/Throat:     Mouth: Mucous membranes are moist.     Pharynx: Oropharynx is clear. No oropharyngeal exudate or posterior oropharyngeal erythema.  Eyes:     Conjunctiva/sclera: Conjunctivae normal.  Pulmonary:     Effort: Pulmonary effort is normal.  Abdominal:     Palpations: Abdomen is soft. There is no mass.     Tenderness: There is no abdominal tenderness. There is no rebound.     Comments: Soft without tenderness,   Genitourinary:    General: Normal vulva.     Exam position: Lithotomy position.     Pubic Area:  No rash or pubic lice.      Labia:        Right: No rash or lesion.        Left: No rash or lesion.      Vagina: No vaginal discharge (grey frothy leukorrhea, ph>4.5), erythema, bleeding or lesions.     Cervix: Normal.     Uterus: Normal.      Adnexa: Right adnexa normal and left adnexa normal.     Rectum: Normal.  Lymphadenopathy:     Head:     Right side of head: No preauricular or posterior auricular adenopathy.     Left side of head: No preauricular or posterior auricular adenopathy.     Cervical: No cervical adenopathy.     Upper Body:     Right upper body: No supraclavicular or axillary adenopathy.     Left upper body: No supraclavicular or axillary adenopathy.     Lower Body: No right inguinal adenopathy. No left inguinal adenopathy.  Skin:    General: Skin is warm and dry.     Findings: No rash.  Neurological:     Mental Status: She is alert and oriented to person, place, and time.      Assessment and Plan:  Alejandra Coleman is a 39 y.o. female presenting to the [facility] Department for STI screening  1. BV (bacterial vaginosis) Treat wet mount per standing orders Immunization nurse consult -  metroNIDAZOLE (FLAGYL) 500 MG tablet; Take 1 tablet (500 mg total) by mouth 2 (two) times daily for 7 days.  Dispense: 14 tablet; Refill: 0     No follow-ups on file.  No future appointments.  Alberteen Spindle, CNM

## 2020-05-03 NOTE — Progress Notes (Signed)
Wet Mount results reviewed. Patient treated for BV per standing orders. Honestee Revard, RN  

## 2020-05-04 LAB — WET PREP FOR TRICH, YEAST, CLUE
Trichomonas Exam: NEGATIVE
Yeast Exam: NEGATIVE

## 2020-05-04 NOTE — Addendum Note (Signed)
Addended by: Arnetha Courser on: 05/04/2020 02:29 PM   Modules accepted: Orders

## 2020-05-11 ENCOUNTER — Emergency Department: Payer: Self-pay

## 2020-05-11 ENCOUNTER — Other Ambulatory Visit: Payer: Self-pay

## 2020-05-11 ENCOUNTER — Emergency Department
Admission: EM | Admit: 2020-05-11 | Discharge: 2020-05-11 | Disposition: A | Discharge status: Home / Self Care | Payer: Self-pay | Attending: Emergency Medicine | Admitting: Emergency Medicine

## 2020-05-11 ENCOUNTER — Encounter: Payer: Self-pay | Admitting: Emergency Medicine

## 2020-05-11 DIAGNOSIS — Z79899 Other long term (current) drug therapy: Secondary | ICD-10-CM | POA: Insufficient documentation

## 2020-05-11 DIAGNOSIS — Y999 Unspecified external cause status: Secondary | ICD-10-CM | POA: Insufficient documentation

## 2020-05-11 DIAGNOSIS — S39012A Strain of muscle, fascia and tendon of lower back, initial encounter: Secondary | ICD-10-CM | POA: Insufficient documentation

## 2020-05-11 DIAGNOSIS — W010XXA Fall on same level from slipping, tripping and stumbling without subsequent striking against object, initial encounter: Secondary | ICD-10-CM | POA: Insufficient documentation

## 2020-05-11 DIAGNOSIS — S29019A Strain of muscle and tendon of unspecified wall of thorax, initial encounter: Secondary | ICD-10-CM

## 2020-05-11 DIAGNOSIS — Y929 Unspecified place or not applicable: Secondary | ICD-10-CM | POA: Insufficient documentation

## 2020-05-11 DIAGNOSIS — Y939 Activity, unspecified: Secondary | ICD-10-CM | POA: Insufficient documentation

## 2020-05-11 DIAGNOSIS — S29012A Strain of muscle and tendon of back wall of thorax, initial encounter: Secondary | ICD-10-CM | POA: Insufficient documentation

## 2020-05-11 LAB — URINALYSIS, COMPLETE (UACMP) WITH MICROSCOPIC
Bacteria, UA: NONE SEEN
Bilirubin Urine: NEGATIVE
Glucose, UA: NEGATIVE mg/dL
Hgb urine dipstick: NEGATIVE
Ketones, ur: NEGATIVE mg/dL
Leukocytes,Ua: NEGATIVE
Nitrite: NEGATIVE
Protein, ur: NEGATIVE mg/dL
Specific Gravity, Urine: 1.019 (ref 1.005–1.030)
WBC, UA: NONE SEEN WBC/hpf (ref 0–5)
pH: 6 (ref 5.0–8.0)

## 2020-05-11 MED ORDER — MELOXICAM 15 MG PO TABS: 15.0000 mg | ORAL_TABLET | Freq: Every day | ORAL | 0 refills | Status: DC

## 2020-05-11 MED ORDER — CYCLOBENZAPRINE HCL 10 MG PO TABS: 10.0000 mg | ORAL_TABLET | Freq: Three times a day (TID) | ORAL | 0 refills | Status: DC | PRN

## 2020-05-11 NOTE — ED Notes (Signed)
Pt ambulatory to room. Pt stating she has been experiencing low back pain and leg pain. Pain 10/10. Pt stating she fell down the steps last pm.

## 2020-05-11 NOTE — ED Provider Notes (Signed)
[facility] Emergency Department Provider Note ____________________________________________  Time seen: Approximately 5:33 PM  I have reviewed the triage vital signs and the nursing notes.   HISTORY  Chief Complaint Back Pain    HPI Alejandra Coleman is a 39 y.o. female who presents to the emergency department for evaluation and treatment of back pain post   fall yesterday down approximately 12 steps.  She denies striking her head or losing consciousness.  No alleviating measures attempted prior to arrival.  History reviewed. No pertinent past medical history.  Patient Active Problem List   Diagnosis Date Noted  . Vapes nicotine containing substance 05/03/2020  . Overweight 05/03/2020    Past Surgical History:  Procedure Laterality Date  . ANKLE SURGERY    . Denies surgical history      Prior to Admission medications   Medication Sig Start Date End Date Taking? Authorizing Provider  cyclobenzaprine (FLEXERIL) 10 MG tablet Take 1 tablet (10 mg total) by mouth 3 (three) times daily as needed for muscle spasms. 05/11/20   Raenah Murley, Rulon Eisenmenger B, FNP  ibuprofen (ADVIL,MOTRIN) 200 MG tablet Take 400 mg by mouth every 6 (six) hours as needed for moderate pain.    [provider]  meloxicam (MOBIC) 15 MG tablet Take 1 tablet (15 mg total) by mouth daily. 05/11/20   Garret Teale, Rulon Eisenmenger B, FNP  omeprazole (PRILOSEC) 20 MG capsule Take 1 capsule (20 mg total) by mouth daily. 07/18/17   Benjiman Core, MD  polyethylene glycol [facility] / GLYCOLAX) packet Take 17 g by mouth daily. 07/18/17   Benjiman Core, MD  Prenatal Multivit-Min-Fe-FA (PRENATAL VITAMINS) 0.8 MG tablet Take 1 tablet daily by mouth. 07/27/17   Rockne Menghini, MD    Allergies Patient has no known allergies.  Family History  Problem Relation Age of Onset  . Diabetes Father   . Heart disease Father   . Heart attack Father   . Diabetes Paternal Grandfather   . Cirrhosis  Paternal Grandfather     Social History Social History   Tobacco Use  . Smoking status: Never Smoker  . Smokeless tobacco: Never Used  Substance Use Topics  . Alcohol use: Yes    Comment: occ  . Drug use: No    Review of Systems Constitutional: Negative for fever. Cardiovascular: Negative for chest pain. Respiratory: Negative for shortness of breath. Musculoskeletal: Positive for left lower back pain Skin: Positive for contusions over the lower extremities. Neurological: Negative for decrease in sensation  ____________________________________________   PHYSICAL EXAM:  VITAL SIGNS: ED Triage Vitals  Enc Vitals Group     BP 05/11/20 1559 100/60     Pulse Rate 05/11/20 1559 67     Resp 05/11/20 1559 20     Temp 05/11/20 1559 98.4 F (36.9 C)     Temp Source 05/11/20 1559 Oral     SpO2 05/11/20 1559 98 %     Weight 05/11/20 1604 170 lb (77.1 kg)     Height 05/11/20 1604 5\' 4"  (1.626 m)     Head Circumference --      Peak Flow --      Pain Score --      Pain Loc --      Pain Edu? --      Excl. in GC? --     Constitutional: Alert and oriented. Well appearing and in no acute distress. Eyes: Conjunctivae are clear without discharge or drainage Head: Atraumatic Neck: Supple.  No focal midline tenderness. Respiratory:  No cough. Respirations are even and unlabored. Musculoskeletal: Tenderness over the lumbar and sacrum on the right side.  Patient able to demonstrate knee flexion without hip pain.  No obvious deformity or effusion over the knee.  No pain over the ankle or foot.  No pain in the upper extremities. Neurologic: Motor and sensory function is intact. Skin: No open wound or lesion.  Multiple contusions overlying the pretibial aspects of the lower extremities. Psychiatric: Affect and behavior are appropriate.  ____________________________________________   LABS (all labs ordered are listed, but only abnormal results are displayed)  Labs Reviewed   URINALYSIS, COMPLETE (UACMP) WITH MICROSCOPIC - Abnormal; Notable for the following components:      Result Value   Color, Urine YELLOW (*)    APPearance CLOUDY (*)    All other components within normal limits   ____________________________________________  RADIOLOGY  No acute findings on images of the thoracic and lumbar spine.  I, Kem Boroughs, personally viewed and evaluated these images (plain radiographs) as part of my medical decision making, as well as reviewing the written report by the radiologist.  DG Thoracic Spine 2 View  Result Date: 05/11/2020 CLINICAL DATA:  Fall, back pain EXAM: THORACIC SPINE 2 VIEWS COMPARISON:  None. FINDINGS: There is no evidence of thoracic spine fracture. Alignment is normal. No other significant bone abnormalities are identified. IMPRESSION: Negative. Electronically Signed   By: Charlett Nose M.D.   On: 05/11/2020 18:12   DG Lumbar Spine 2-3 Views  Result Date: 05/11/2020 CLINICAL DATA:  Fall, mid and lower back pain EXAM: LUMBAR SPINE - 2-3 VIEW COMPARISON:  None. FINDINGS: There is no evidence of lumbar spine fracture. Alignment is normal. Intervertebral disc spaces are maintained. IMPRESSION: Negative. Electronically Signed   By: Charlett Nose M.D.   On: 05/11/2020 18:12   ____________________________________________   PROCEDURES  Procedures  ____________________________________________   INITIAL IMPRESSION / ASSESSMENT AND PLAN / ED COURSE  Alejandra Coleman is a 40 y.o. who presents to the emergency department for treatment and evaluation after mechanical, nonsyncopal fall down a flight of stairs yesterday evening.  See HPI for further details.  Plan will be to get images of the thoracic lumbar spine as well as urinalysis to ensure she does not have any indication of kidney contusion.  X-ray of the thoracic and lumbar spine are negative for acute findings.  Urinalysis is negative for hemoglobin or red blood cells.  Patient  instructed to follow-up with primary care for symptoms not improving over the week.Marland Kitchen  She was also instructed to return to the emergency department for symptoms that change or worsen if unable schedule an appointment with orthopedics or primary care.  Medications - No data to display  Pertinent labs & imaging results that were available during my care of the patient were reviewed by me and considered in my medical decision making (see chart for details).   _________________________________________   FINAL CLINICAL IMPRESSION(S) / ED DIAGNOSES  Final diagnoses:  Acute myofascial strain of lumbar region, initial encounter  Acute thoracic myofascial strain, initial encounter    ED Discharge Orders         Ordered    cyclobenzaprine (FLEXERIL) 10 MG tablet  3 times daily PRN        05/11/20 1906    meloxicam (MOBIC) 15 MG tablet  Daily        05/11/20 1906           If controlled substance prescribed during this visit,  12 month history viewed on the [facility] prior to issuing an initial prescription for Schedule II or III opiod.   Chinita Pester, FNP 05/11/20 2322    Jene Every, MD 05/16/20 1301

## 2020-05-11 NOTE — ED Notes (Signed)
Interpreter requested 

## 2020-05-11 NOTE — ED Triage Notes (Signed)
Pt presents to ED via POV with c/o lower back pain and R leg pain, pt states slipped and slid down approx 12 steps last night on her back. Pt states pain with ambulation. Pt visualized in NAD, playing on phone in triage.   Maryjane Hurter, Interpreter in triage at this time.

## 2020-06-06 ENCOUNTER — Other Ambulatory Visit: Payer: Self-pay

## 2020-06-06 ENCOUNTER — Ambulatory Visit (LOCAL_COMMUNITY_HEALTH_CENTER): Payer: Self-pay

## 2020-06-06 DIAGNOSIS — Z539 Procedure and treatment not carried out, unspecified reason: Secondary | ICD-10-CM

## 2020-06-06 NOTE — Progress Notes (Signed)
Pt scheduled for depo today.  Left without being seen. RN called pt to begin visit and she cannot be found in waiting areas, bathroom, clinic area. Pt did not respond to overhead page. Phone call attempted to number on file, but message states out of service. Jerel Shepherd, RN

## 2020-06-13 ENCOUNTER — Encounter: Payer: Self-pay | Admitting: Emergency Medicine

## 2020-06-13 ENCOUNTER — Emergency Department: Payer: HRSA Program

## 2020-06-13 ENCOUNTER — Emergency Department
Admission: EM | Admit: 2020-06-13 | Discharge: 2020-06-13 | Disposition: A | Discharge status: Home / Self Care | Payer: HRSA Program | Attending: Student in an Organized Health Care Education/Training Program | Admitting: Student in an Organized Health Care Education/Training Program

## 2020-06-13 ENCOUNTER — Other Ambulatory Visit: Payer: Self-pay

## 2020-06-13 DIAGNOSIS — U071 COVID-19: Secondary | ICD-10-CM | POA: Diagnosis not present

## 2020-06-13 DIAGNOSIS — R509 Fever, unspecified: Secondary | ICD-10-CM | POA: Diagnosis present

## 2020-06-13 LAB — RESPIRATORY PANEL BY RT PCR (FLU A&B, COVID)
Influenza A by PCR: NEGATIVE
Influenza B by PCR: NEGATIVE
SARS Coronavirus 2 by RT PCR: POSITIVE — AB

## 2020-06-13 MED ORDER — AZITHROMYCIN 250 MG PO TABS: ORAL_TABLET | ORAL | 0 refills | Status: AC

## 2020-06-13 MED ORDER — PSEUDOEPH-BROMPHEN-DM 30-2-10 MG/5ML PO SYRP: 5.0000 mL | ORAL_SOLUTION | Freq: Four times a day (QID) | ORAL | 0 refills | Status: DC | PRN

## 2020-06-13 MED ORDER — METHYLPREDNISOLONE 4 MG PO TBPK: ORAL_TABLET | ORAL | 0 refills | Status: DC

## 2020-06-13 NOTE — ED Provider Notes (Signed)
[facility] Emergency Department Provider Note   ____________________________________________   First MD Initiated Contact with Patient 06/13/20 [phone]     (approximate)  I have reviewed the triage vital signs and the nursing notes.   HISTORY  Chief Complaint Fever, Cough, and Generalized Body Aches    HPI Alejandra Coleman is a 39 y.o. female patient presents with fever/chills, cough and body aches for 1 week.  States she has been exposed to COVID-19.  Patient denies loss of taste or smell.  Patient denies nausea, vomiting, diarrhea.  Patient said cough is productive.  Patient rates the pain as 8/10.  Patient described pain is "achy".  No palliative measures for complaint.         History reviewed. No pertinent past medical history.  Patient Active Problem List   Diagnosis Date Noted  . Vapes nicotine containing substance 05/03/2020  . Overweight 05/03/2020    Past Surgical History:  Procedure Laterality Date  . ANKLE SURGERY    . Denies surgical history      Prior to Admission medications   Medication Sig Start Date End Date Taking? Authorizing Provider  azithromycin (ZITHROMAX Z-PAK) 250 MG tablet Take 2 tablets (500 mg) on  Day 1,  followed by 1 tablet (250 mg) once daily on Days 2 through 5. 06/13/20 06/18/20  Joni Reining, PA-C  brompheniramine-pseudoephedrine-DM 30-2-10 MG/5ML syrup Take 5 mLs by mouth 4 (four) times daily as needed. 06/13/20   Joni Reining, PA-C  meloxicam (MOBIC) 15 MG tablet Take 1 tablet (15 mg total) by mouth daily. 05/11/20   Kem Boroughs B, FNP  methylPREDNISolone (MEDROL DOSEPAK) 4 MG TBPK tablet Take Tapered dose as directed 06/13/20   Joni Reining, PA-C    Allergies Patient has no known allergies.  Family History  Problem Relation Age of Onset  . Diabetes Father   . Heart disease Father   . Heart attack Father   . Diabetes Paternal Grandfather   . Cirrhosis Paternal Grandfather      Social History Social History   Tobacco Use  . Smoking status: Never Smoker  . Smokeless tobacco: Never Used  Substance Use Topics  . Alcohol use: Yes    Comment: occ  . Drug use: No    Review of Systems Constitutional: Fever/chills and body aches. Eyes: No visual changes. ENT: No sore throat. Cardiovascular: Denies chest pain. Respiratory: Denies shortness of breath.  Productive cough. Gastrointestinal: No abdominal pain.  No nausea, no vomiting.  No diarrhea.  No constipation. Genitourinary: Negative for dysuria. Musculoskeletal: Negative for back pain. Skin: Negative for rash. Neurological: Negative for headaches, focal weakness or numbness.   ____________________________________________   PHYSICAL EXAM:  VITAL SIGNS: ED Triage Vitals  Enc Vitals Group     BP 06/13/20 0826 94/60     Pulse Rate 06/13/20 0826 (!) 114     Resp 06/13/20 0826 20     Temp 06/13/20 0826 99.9 F (37.7 C)     Temp Source 06/13/20 0826 Oral     SpO2 06/13/20 0826 97 %     Weight 06/13/20 0755 170 lb (77.1 kg)     Height 06/13/20 0755 5\' 4"  (1.626 m)     Head Circumference --      Peak Flow --      Pain Score 06/13/20 0755 8     Pain Loc --      Pain Edu? --      Excl. in GC? --  Constitutional: Alert and oriented. Well appearing and in no acute distress. Eyes: Conjunctivae are normal. PERRL. EOMI. Head: Atraumatic. Nose: Edematous nasal turbinates with clear rhinorrhea. Mouth/Throat: Mucous membranes are moist.  Oropharynx non-erythematous.  Postnasal drainage. Neck: No stridor.  Hematological/Lymphatic/Immunilogical: No cervical lymphadenopathy. Cardiovascular: Tachycardic, regular rhythm. Grossly normal heart sounds.  Good peripheral circulation. Respiratory: Normal respiratory effort.  No retractions. Lungs right rhonchi breath sounds. Genitourinary: Deferred Musculoskeletal: No lower extremity tenderness nor edema.  No joint effusions. Neurologic:  Normal speech and  language. No gross focal neurologic deficits are appreciated. No gait instability. Skin:  Skin is warm, dry and intact. No rash noted. Psychiatric: Mood and affect are normal. Speech and behavior are normal.  ____________________________________________   LABS (all labs ordered are listed, but only abnormal results are displayed)  Labs Reviewed  RESPIRATORY PANEL BY RT PCR (FLU A&B, COVID) - Abnormal; Notable for the following components:      Result Value   SARS Coronavirus 2 by RT PCR POSITIVE (*)    All other components within normal limits   ____________________________________________  EKG   ____________________________________________  RADIOLOGY  ED MD interpretation:    Official radiology report(s): DG Chest 2 View  Result Date: 06/13/2020 CLINICAL DATA:  Cough, fever, chills, and body aches for past week, COVID-19 exposure EXAM: CHEST - 2 VIEW COMPARISON:  None FINDINGS: Normal heart size, mediastinal contours, and pulmonary vascularity. RIGHT basilar infiltrate consistent with pneumonia. Minimal central peribronchial thickening. Remaining lungs clear. No pleural effusion or pneumothorax. Osseous structures unremarkable. IMPRESSION: Bronchitic changes with RIGHT basilar infiltrate consistent with pneumonia Electronically Signed   By: Ulyses Southward M.D.   On: 06/13/2020 08:56    ____________________________________________   PROCEDURES  Procedure(s) performed (including Critical Care):  Procedures   ____________________________________________   INITIAL IMPRESSION / ASSESSMENT AND PLAN / ED COURSE  As part of my medical decision making, I reviewed the following data within the electronic MEDICAL RECORD NUMBER    Patient presents with fever/chills, body aches and productive cough.  Discussed x-ray findings consistent with pneumonia.  Patient COVID-19 test was positive.  Patient given discharge care instruction advised self quarantine.  Patient vies take medication as  directed and to consider COVID-19 vaccine after quarantine.         ____________________________________________   FINAL CLINICAL IMPRESSION(S) / ED DIAGNOSES  Final diagnoses:  COVID-19 virus infection     ED Discharge Orders         Ordered    brompheniramine-pseudoephedrine-DM 30-2-10 MG/5ML syrup  4 times daily PRN        06/13/20 1017    azithromycin (ZITHROMAX Z-PAK) 250 MG tablet        06/13/20 1017    methylPREDNISolone (MEDROL DOSEPAK) 4 MG TBPK tablet        06/13/20 1017          *Please note:  Alejandra Coleman was evaluated in Emergency Department on 06/13/2020 for the symptoms described in the history of present illness. She was evaluated in the context of the global COVID-19 pandemic, which necessitated consideration that the patient might be at risk for infection with the SARS-CoV-2 virus that causes COVID-19. Institutional protocols and algorithms that pertain to the evaluation of patients at risk for COVID-19 are in a state of rapid change based on information released by regulatory bodies including the CDC and federal and state organizations. These policies and algorithms were followed during the patient's care in the ED.  Some ED evaluations and interventions  may be delayed as a result of limited staffing during and the pandemic.*   Note:  This document was prepared using Dragon voice recognition software and may include unintentional dictation errors.    Joni Reining, PA-C 06/13/20 1028    Willy Eddy, MD 06/13/20 1124

## 2020-06-13 NOTE — Discharge Instructions (Addendum)
Advised self quarantine for 10 days.  Follow discharge care instruction take medication as directed.  Advised extra Tylenol as needed for fever/body aches.

## 2020-06-13 NOTE — ED Triage Notes (Signed)
Pt reports fever, chills, cough and bodyaches for the past week. Pt requesting a COVID test and has been exposed.

## 2020-06-14 ENCOUNTER — Telehealth: Payer: Self-pay | Admitting: Family

## 2020-06-14 NOTE — Telephone Encounter (Signed)
Called to Discuss with patient about Covid symptoms and the use of the monoclonal antibody infusion for those with mild to moderate Covid symptoms and at a high risk of hospitalization.     Pt appears to qualify for this infusion due to co-morbid conditions and/or a member of an at-risk group in accordance with the FDA Emergency Use Authorization.    [name] was seen in the [facility] ED on 9/29 with fever/chills, cough and body aches for 1 week. Qualifying risk factors include BMI > 25, nicotine use, and high SVI.   Attempted to get in contact with [name] through [employer] however received a Engineer, technical sales. A message was left for her to call the hotline. No Mychart has been set up.   Marcos Eke, NP 06/14/2020 3:31 PM

## 2020-06-28 ENCOUNTER — Other Ambulatory Visit: Payer: Self-pay | Admitting: Advanced Practice Midwife

## 2020-06-28 DIAGNOSIS — Z113 Encounter for screening for infections with a predominantly sexual mode of transmission: Secondary | ICD-10-CM

## 2020-07-11 ENCOUNTER — Ambulatory Visit: Payer: Self-pay

## 2020-08-24 ENCOUNTER — Ambulatory Visit: Payer: Self-pay

## 2020-09-02 ENCOUNTER — Encounter: Payer: Self-pay | Admitting: Emergency Medicine

## 2020-09-02 ENCOUNTER — Emergency Department
Admission: EM | Admit: 2020-09-02 | Discharge: 2020-09-02 | Disposition: A | Discharge status: Home / Self Care | Payer: Self-pay | Attending: Emergency Medicine | Admitting: Emergency Medicine

## 2020-09-02 ENCOUNTER — Other Ambulatory Visit: Payer: Self-pay

## 2020-09-02 DIAGNOSIS — S6992XA Unspecified injury of left wrist, hand and finger(s), initial encounter: Secondary | ICD-10-CM

## 2020-09-02 DIAGNOSIS — S61309A Unspecified open wound of unspecified finger with damage to nail, initial encounter: Secondary | ICD-10-CM | POA: Insufficient documentation

## 2020-09-02 DIAGNOSIS — Z23 Encounter for immunization: Secondary | ICD-10-CM | POA: Insufficient documentation

## 2020-09-02 DIAGNOSIS — Z79899 Other long term (current) drug therapy: Secondary | ICD-10-CM | POA: Insufficient documentation

## 2020-09-02 DIAGNOSIS — W19XXXA Unspecified fall, initial encounter: Secondary | ICD-10-CM | POA: Insufficient documentation

## 2020-09-02 MED ORDER — IBUPROFEN 600 MG PO TABS: 600.0000 mg | ORAL_TABLET | Freq: Three times a day (TID) | ORAL | 0 refills | Status: DC | PRN

## 2020-09-02 MED ORDER — IBUPROFEN 600 MG PO TABS
600.0000 mg | ORAL_TABLET | Freq: Once | ORAL | Status: AC
  Administered 2020-09-02: 14:00:00 600 mg via ORAL
  Filled 2020-09-02: qty 1

## 2020-09-02 MED ORDER — TETANUS-DIPHTH-ACELL PERTUSSIS 5-2.5-18.5 LF-MCG/0.5 IM SUSY
0.5000 mL | PREFILLED_SYRINGE | Freq: Once | INTRAMUSCULAR | Status: AC
  Administered 2020-09-02: 14:00:00 0.5 mL via INTRAMUSCULAR
  Filled 2020-09-02: qty 0.5

## 2020-09-02 MED ORDER — IBUPROFEN 800 MG PO TABS: 800.0000 mg | ORAL_TABLET | Freq: Three times a day (TID) | ORAL | 0 refills | Status: DC | PRN

## 2020-09-02 NOTE — Discharge Instructions (Signed)
Keep area clean and dry.  Ibuprofen for pain as needed See your nail technician tomorrow for removal of acrylic nail.

## 2020-09-02 NOTE — ED Triage Notes (Signed)
Pt reports broke a nail on her left hand thumb and it is bleeding uderneath

## 2020-09-02 NOTE — ED Provider Notes (Signed)
[facility] Emergency Department Provider Note  ____________________________________________   Event Date/Time   First MD Initiated Contact with Patient 09/02/20 1220     (approximate)  I have reviewed the triage vital signs and the nursing notes.   HISTORY  Chief Complaint Nail Problem Spanish interpreter also used.   HPI Alejandra Coleman [name] is a 39 y.o. female presents with complaint of left thumb pain.  Patient states that she fell and describes what sounds like hyperextension of her acrylic nail.  Patient states that his bleeding under the nail.  Patient states that she is not concerned that her thumb is broken and she has been using it without any pain and that only the nail hyperextended rather than her thumb.  Patient reports that her tetanus has been over 10 years.       History reviewed. No pertinent past medical history.  Patient Active Problem List   Diagnosis Date Noted  . Vapes nicotine containing substance 05/03/2020  . Overweight 05/03/2020    Past Surgical History:  Procedure Laterality Date  . ANKLE SURGERY    . Denies surgical history      Prior to Admission medications   Medication Sig Start Date End Date Taking? Authorizing Provider  ibuprofen (ADVIL) 800 MG tablet Take 1 tablet (800 mg total) by mouth every 8 (eight) hours as needed. 09/02/20   Tommi Rumps, PA-C    Allergies Patient has no known allergies.  Family History  Problem Relation Age of Onset  . Diabetes Father   . Heart disease Father   . Heart attack Father   . Diabetes Paternal Grandfather   . Cirrhosis Paternal Grandfather     Social History Social History   Tobacco Use  . Smoking status: Never Smoker  . Smokeless tobacco: Never Used  Substance Use Topics  . Alcohol use: Yes    Comment: occ  . Drug use: No    Review of Systems Constitutional: No fever/chills Eyes: No visual changes. ENT: No trauma. Cardiovascular: Denies  chest pain. Respiratory: Denies shortness of breath. Gastrointestinal: No abdominal pain.  No nausea, no vomiting. Musculoskeletal: Left thumb pain.  Injury left thumbnail. Skin: Negative for rash. Neurological: Negative for headaches, focal weakness or numbness. ____________________________________________   PHYSICAL EXAM:  VITAL SIGNS: ED Triage Vitals  Enc Vitals Group     BP 09/02/20 1140 127/73     Pulse Rate 09/02/20 1140 (!) 58     Resp 09/02/20 1140 16     Temp 09/02/20 1140 98.4 F (36.9 C)     Temp Source 09/02/20 1140 Oral     SpO2 09/02/20 1140 100 %     Weight 09/02/20 1136 175 lb (79.4 kg)     Height 09/02/20 1136 5\' 4"  (1.626 m)     Head Circumference --      Peak Flow --      Pain Score 09/02/20 1136 10     Pain Loc --      Pain Edu? --      Excl. in GC? --     Constitutional: Alert and oriented. Well appearing and in no acute distress. Eyes: Conjunctivae are normal.  Head: Atraumatic. Neck: No stridor.   Cardiovascular: Normal rate, regular rhythm. Grossly normal heart sounds.  Good peripheral circulation. Respiratory: Normal respiratory effort.  No retractions. Lungs CTAB. Musculoskeletal: Semination of left hand there is no gross deformity and no soft tissue discoloration noted.  Joints are without deformity or soft  tissue edema.  Patient is able to flex and extend her thumb fully without any difficulties.  On examination of the nail there is a partial avulsion injury to the distal portion with the nail intact at the nail bed.  No active bleeding is noted.  Patient has acrylic nails measuring approximately 2 inches. Neurologic:  Normal speech and language. No gross focal neurologic deficits are appreciated. No gait instability. Skin:  Skin is warm, dry and intact.  Psychiatric: Mood and affect are normal. Speech and behavior are normal.  ____________________________________________   LABS (all labs ordered are listed, but only abnormal results are  displayed)  Labs Reviewed - No data to display ____________________________________________  PROCEDURES  Procedure(s) performed (including Critical Care):  Procedures   ____________________________________________   INITIAL IMPRESSION / ASSESSMENT AND PLAN / ED COURSE  As part of my medical decision making, I reviewed the following data within the electronic MEDICAL RECORD NUMBER Notes from prior ED visits and [place] Controlled Substance Database  39 year old female presents to the ED with injury to her left thumbnail in which it was partially avulsed.  Nail is intact at the nailbed.  Patient currently has acrylic nails that are very long.  We discussed that she should see her nail technician tomorrow to see if the acrylic nail can be separated from her natural nail.  She is to keep the area clean and dry and watch for any signs of infection.  There is no laceration no active bleeding at the time of discharge.  Patient was given a prescription for ibuprofen 800 mg every 8 hours as needed for pain.  ____________________________________________   FINAL CLINICAL IMPRESSION(S) / ED DIAGNOSES  Final diagnoses:  Fingernail injury, left, initial encounter     ED Discharge Orders         Ordered    ibuprofen (ADVIL) 600 MG tablet  Every 8 hours PRN,   Status:  Discontinued        09/02/20 1334    ibuprofen (ADVIL) 800 MG tablet  Every 8 hours PRN        09/02/20 1337          *Please note:  [name] was evaluated in Emergency Department on 09/02/2020 for the symptoms described in the history of present illness. She was evaluated in the context of the global COVID-19 pandemic, which necessitated consideration that the patient might be at risk for infection with the SARS-CoV-2 virus that causes COVID-19. Institutional protocols and algorithms that pertain to the evaluation of patients at risk for COVID-19 are in a state of rapid change based on information released by  regulatory bodies including the CDC and federal and state organizations. These policies and algorithms were followed during the patient's care in the ED.  Some ED evaluations and interventions may be delayed as a result of limited staffing during and the pandemic.*   Note:  This document was prepared using Dragon voice recognition software and may include unintentional dictation errors.    Tommi Rumps, PA-C 09/02/20 1458    Shaune Pollack, MD 09/02/20 [phone]

## 2020-09-27 ENCOUNTER — Encounter: Payer: Self-pay | Admitting: Physician Assistant

## 2020-09-27 ENCOUNTER — Other Ambulatory Visit: Payer: Self-pay

## 2020-09-27 ENCOUNTER — Ambulatory Visit (LOCAL_COMMUNITY_HEALTH_CENTER): Payer: Self-pay | Admitting: Physician Assistant

## 2020-09-27 VITALS — BP 105/68 | Ht 61.0 in | Wt 161.8 lb

## 2020-09-27 DIAGNOSIS — Z3202 Encounter for pregnancy test, result negative: Secondary | ICD-10-CM

## 2020-09-27 DIAGNOSIS — Z3042 Encounter for surveillance of injectable contraceptive: Secondary | ICD-10-CM

## 2020-09-27 DIAGNOSIS — Z3200 Encounter for pregnancy test, result unknown: Secondary | ICD-10-CM

## 2020-09-27 DIAGNOSIS — Z3009 Encounter for other general counseling and advice on contraception: Secondary | ICD-10-CM

## 2020-09-27 LAB — WET PREP FOR TRICH, YEAST, CLUE
Trichomonas Exam: NEGATIVE
Yeast Exam: NEGATIVE

## 2020-09-27 LAB — PREGNANCY, URINE: Preg Test, Ur: NEGATIVE

## 2020-09-27 MED ORDER — MEDROXYPROGESTERONE ACETATE 150 MG/ML IM SUSP
150.0000 mg | Freq: Once | INTRAMUSCULAR | Status: AC
Start: 2020-09-27 — End: 2020-09-27
  Administered 2020-09-27: 150 mg via INTRAMUSCULAR

## 2020-09-27 NOTE — Progress Notes (Signed)
[facility] problem [facility]  [facility]  Subjective:  Alejandra Coleman is a 40 y.o. being seen today for   Chief Complaint  Patient presents with  . Contraception    Wants to restart depo (in arm)  . SEXUALLY TRANSMITTED DISEASE    Desires STD testing including bloodwork    40 yo woman here requesting to restart Depo for contraception, and for eval of 1 week h/o vaginal itching and burning. No vaginal discharge or odor, no urinary sx. She has not tried anything at home for these sx. No new sexual partners. LMP 09/22/20. Last DMPA 03/21/20, no probs with DMPA, apparently was here 06/28/20, but left prior to getting her injection. Had well-woman exam at [facility] in [place] during last year. We have not received those records - pt states she has a copy at her home and plans to bring a copy here.    Does the patient have a current or past history of drug use? No   No components found for: HCV]   Health Maintenance Due  Topic Date Due  . Hepatitis C Screening  Never done  . COVID-19 Vaccine (1) Never done  . PAP SMEAR-Modifier  01/30/2020  . INFLUENZA VACCINE  Never done    ROS  The following portions of the patient's history were reviewed and updated as appropriate: allergies, current medications, past family history, past medical history, past social history, past surgical history and problem list. Problem list updated.   See flowsheet for other program required questions.  Objective:   Vitals:   09/27/20 1106  BP: 105/68  Weight: 161 lb 12.8 oz (73.4 kg)  Height: 5\' 1"  (1.549 m)    Physical Exam Vitals and nursing note reviewed.  Constitutional:      Appearance: Normal appearance.  HENT:     Mouth/Throat:     Mouth: Mucous membranes are moist.     Pharynx: Oropharynx is clear. No oropharyngeal exudate or posterior oropharyngeal erythema.  Pulmonary:     Effort: Pulmonary effort is normal.  Chest:   Breasts:     Right: No axillary adenopathy or supraclavicular adenopathy.     Left: No axillary adenopathy or supraclavicular adenopathy.    Abdominal:     Palpations: Abdomen is soft. There is no mass.     Tenderness: There is no abdominal tenderness. There is no rebound.  Genitourinary:    General: Normal vulva.     Exam position: Lithotomy position.     Pubic Area: No rash or pubic lice.      Labia:        Right: No rash or lesion.        Left: No rash or lesion.      Vagina: Bleeding present. No vaginal discharge, erythema or lesions.     Cervix: No cervical motion tenderness, discharge, friability, lesion or erythema.     Uterus: Normal.      Adnexa: Right adnexa normal and left adnexa normal.     Rectum: Normal.  Lymphadenopathy:     Head:     Right side of head: No preauricular or posterior auricular adenopathy.     Left side of head: No preauricular or posterior auricular adenopathy.     Cervical: No cervical adenopathy.     Upper Body:     Right upper body: No supraclavicular or axillary adenopathy.     Left upper body: No supraclavicular or axillary adenopathy.  Lower Body: No right inguinal adenopathy. No left inguinal adenopathy.  Skin:    General: Skin is warm and dry.     Findings: No rash.  Neurological:     Mental Status: She is alert and oriented to person, place, and time.       Assessment and Plan:  Alejandra Coleman is a 40 y.o. female presenting to the [facility] Department for a Women's Health problem visit  1. Possible pregnancy, not yet confirmed UPT = neg - Pregnancy, urine  2. Family planning services Restart DMPA today 150mg  IM. Pt to bring copy of outside physical exam. Wet prep neg today. Await other test results. Return for reassessment if vag sx worsen or persist.  - Chlamydia/Gonorrhea [place] [facility] - WET PREP FOR TRICH, YEAST, CLUE - HIV [place] [facility] - Syphilis Serology, [place] [facility]   Return in about 3  months (around 12/26/2020) for Routine DMPA injection. Pt also considering switch to Nexplanon.  No future appointments.  12/28/2020, PA-C

## 2020-09-27 NOTE — Progress Notes (Signed)
Pt counseled that records were not received by [facility] (requested by [facility] in July 2021). Pt states she believes she has copy of records at home and will bring to [facility]. Desires to restart depo today; is 27.1 weeks post depo today. Pt also desires STD checks including bloodwork.

## 2020-09-27 NOTE — Progress Notes (Signed)
Wet mount reviewed, no tx per standing orders. Pt confirmed she can bring copies of previous physical/pap/notes from previous provider. Provider orders completed.

## 2020-10-05 ENCOUNTER — Encounter: Payer: Self-pay | Admitting: Student

## 2020-10-05 LAB — HM HIV SCREENING LAB: HM HIV Screening: NEGATIVE

## 2021-01-03 ENCOUNTER — Ambulatory Visit: Payer: Self-pay

## 2021-01-31 ENCOUNTER — Encounter: Payer: Self-pay | Admitting: Physician Assistant

## 2021-01-31 ENCOUNTER — Encounter: Payer: Self-pay | Admitting: Family Medicine

## 2021-01-31 ENCOUNTER — Other Ambulatory Visit: Payer: Self-pay

## 2021-01-31 ENCOUNTER — Ambulatory Visit (LOCAL_COMMUNITY_HEALTH_CENTER): Payer: Self-pay | Admitting: Physician Assistant

## 2021-01-31 VITALS — BP 117/82 | HR 72 | Ht 61.0 in | Wt 154.8 lb

## 2021-01-31 DIAGNOSIS — Z3009 Encounter for other general counseling and advice on contraception: Secondary | ICD-10-CM

## 2021-01-31 DIAGNOSIS — Z30013 Encounter for initial prescription of injectable contraceptive: Secondary | ICD-10-CM

## 2021-01-31 LAB — WET PREP FOR TRICH, YEAST, CLUE
Trichomonas Exam: NEGATIVE
Yeast Exam: NEGATIVE

## 2021-01-31 MED ORDER — METRONIDAZOLE 500 MG PO TABS: 500.0000 mg | ORAL_TABLET | Freq: Two times a day (BID) | ORAL | 0 refills | Status: AC

## 2021-01-31 MED ORDER — METRONIDAZOLE 500 MG PO TABS: 500.0000 mg | ORAL_TABLET | Freq: Two times a day (BID) | ORAL | 0 refills | Status: DC

## 2021-01-31 MED ORDER — MEDROXYPROGESTERONE ACETATE 150 MG/ML IM SUSP
150.0000 mg | INTRAMUSCULAR | Status: AC
  Administered 2021-01-31 – 2021-04-18 (×2): 150 mg via INTRAMUSCULAR

## 2021-01-31 NOTE — Progress Notes (Signed)
Wet mount reviewed by A. Streilein PA-C and client treated for BV per standing orders. Jossie Ng, RN

## 2021-01-31 NOTE — Progress Notes (Signed)
[facility] problem [facility]  [facility]  Subjective:  Alejandra Coleman is a 40 y.o. being seen today for   Chief Complaint  Patient presents with  . Contraception    Desires to reinitiate Depo.    40 yo woman here for routine DMPA and requests STI eval. States partner has another partner. Also states she has had 1 week vaginal discharge with odor and itching. Did not bring [place] physical exam records.    Does the patient have a current or past history of drug use? No   No components found for: HCV]   Health Maintenance Due  Topic Date Due  . COVID-19 Vaccine (1) Never done  . Hepatitis C Screening  Never done  . PAP SMEAR-Modifier  01/30/2020    ROS  The following portions of the patient's history were reviewed and updated as appropriate: allergies, current medications, past family history, past medical history, past social history, past surgical history and problem list. Problem list updated.   See flowsheet for other program required questions.  Objective:   Vitals:   01/31/21 1523  BP: 117/82  Pulse: 72  Weight: 154 lb 12.8 oz (70.2 kg)  Height: 5\' 1"  (1.549 m)    Physical Exam Vitals and nursing note reviewed.  Constitutional:      Appearance: Normal appearance.  HENT:     Head: Normocephalic and atraumatic.     Mouth/Throat:     Mouth: Mucous membranes are moist.     Pharynx: Oropharynx is clear. No oropharyngeal exudate or posterior oropharyngeal erythema.  Pulmonary:     Effort: Pulmonary effort is normal.  Chest:  Breasts:     Right: No axillary adenopathy or supraclavicular adenopathy.     Left: No axillary adenopathy or supraclavicular adenopathy.    Abdominal:     General: Abdomen is flat.     Palpations: There is no mass.     Tenderness: There is no abdominal tenderness. There is no rebound.  Genitourinary:    General: Normal vulva.     Exam position: Lithotomy position.     Pubic Area:  No rash or pubic lice.      Labia:        Right: No rash or lesion.        Left: No rash or lesion.      Vagina: Vaginal discharge present. No erythema, bleeding or lesions.     Cervix: No cervical motion tenderness, discharge, friability, lesion or erythema.     Uterus: Normal.      Adnexa: Right adnexa normal and left adnexa normal.     Rectum: Normal.     Comments: White, thick, pH>4.5 vag discharge Lymphadenopathy:     Head:     Right side of head: No preauricular or posterior auricular adenopathy.     Left side of head: No preauricular or posterior auricular adenopathy.     Cervical: No cervical adenopathy.     Upper Body:     Right upper body: No supraclavicular or axillary adenopathy.     Left upper body: No supraclavicular or axillary adenopathy.     Lower Body: No right inguinal adenopathy. No left inguinal adenopathy.  Skin:    General: Skin is warm and dry.     Findings: No rash.  Neurological:     Mental Status: She is alert and oriented to person, place, and time.       Assessment and Plan:   Alejandra Coleman is a 40 y.o. female presenting to the [facility] Department for a Women's Health problem visit  1. Family planning services Give DMPA today. History/exam/wet prep c/w bacterial vaginosis - treat per S.O.  - IGP, Aptima HPV - Chlamydia/Gonorrhea [place] [facility] - WET PREP FOR TRICH, YEAST, CLUE - HIV/HCV [place] [facility] - Syphilis Serology, [place] [facility] - medroxyPROGESTERone (DEPO-PROVERA) injection 150 mg - metroNIDAZOLE (FLAGYL) 500 MG tablet; Take 1 tablet (500 mg total) by mouth 2 (two) times daily for 7 days.  Dispense: 14 tablet; Refill: 0     No follow-ups on file.  No future appointments.  Alejandra Dyke, PA-C

## 2021-02-05 LAB — IGP, APTIMA HPV
HPV Aptima: POSITIVE — AB
PAP Smear Comment: 0

## 2021-02-06 LAB — HM HIV SCREENING LAB: HM HIV Screening: NEGATIVE

## 2021-02-06 LAB — HM HEPATITIS C SCREENING LAB: HM Hepatitis Screen: NEGATIVE

## 2021-03-19 NOTE — Progress Notes (Signed)
Result was not triaged originally and found on routine quality assurance checks.   NIL with POS HPV. Needs repeat pap in 1 year. I have updated the HM and routed to Pap RN.

## 2021-03-28 ENCOUNTER — Encounter: Payer: Self-pay | Admitting: Nurse Practitioner

## 2021-03-28 NOTE — Progress Notes (Signed)
PAP normal, HPV positive.  Repeat PAP in 1 year (01/2022) per Lyndel Safe, MD. PAP card mailed today.  MyCHART account not active. Glenna Fellows, RN

## 2021-04-11 ENCOUNTER — Ambulatory Visit: Payer: Self-pay

## 2021-04-18 ENCOUNTER — Other Ambulatory Visit: Payer: Self-pay

## 2021-04-18 ENCOUNTER — Ambulatory Visit (LOCAL_COMMUNITY_HEALTH_CENTER): Payer: Self-pay

## 2021-04-18 VITALS — BP 105/71 | Ht 61.0 in | Wt 151.0 lb

## 2021-04-18 DIAGNOSIS — Z3009 Encounter for other general counseling and advice on contraception: Secondary | ICD-10-CM

## 2021-04-18 DIAGNOSIS — Z3042 Encounter for surveillance of injectable contraceptive: Secondary | ICD-10-CM

## 2021-04-18 NOTE — Progress Notes (Signed)
11 weeks post depo. Declines interpreter today. Speaks and understands English during visit. Voices no concerns. Depo consent signed today. Depo administered today per order by A. Streilein, PA dated 01/31/2021. Tolerated well L deltoid. Next depo due 07/04/2021, has reminder. Jerel Shepherd, RN

## 2021-07-12 ENCOUNTER — Ambulatory Visit: Payer: Self-pay

## 2021-08-14 ENCOUNTER — Ambulatory Visit: Payer: Self-pay

## 2021-10-08 ENCOUNTER — Other Ambulatory Visit: Payer: Self-pay

## 2021-10-08 ENCOUNTER — Ambulatory Visit (LOCAL_COMMUNITY_HEALTH_CENTER): Payer: Self-pay

## 2021-10-08 VITALS — BP 106/76 | Ht 61.0 in | Wt 162.5 lb

## 2021-10-08 DIAGNOSIS — Z3009 Encounter for other general counseling and advice on contraception: Secondary | ICD-10-CM

## 2021-10-08 NOTE — Progress Notes (Signed)
In Nurse Clinic requesting Emergency Contraception. Alejandra Coleman, interpreter.  Using no bcm. Last depo 04/18/2021 (24 weeks 5 days since last depo.) LMP 09/05/2021 Multiple episodes of unprotected sex during January 2023. Pt states sex 3x/week. Dates given: Jan. 8,14,15,21, 22, 23.  Pt has [facility] provider appt 10/18/21 to restart depo.   Consult Neville Route, FNP who explains pt is not a candidate for Emerg. Contraception today d/t multiple episodes of unprotected sex this month. Advises pt to abstain from sex or use condoms  until [facility] appt 10/18/2021.  When RN returned to pt exam room, pt had left.  Phone call to pt Alejandra Coleman, interpreter). RN explained provider recommendations. Pt states ok. Reports understanding. Josie Saunders, RN

## 2021-10-18 ENCOUNTER — Ambulatory Visit: Payer: Self-pay

## 2021-12-02 ENCOUNTER — Other Ambulatory Visit: Payer: Self-pay

## 2021-12-02 ENCOUNTER — Ambulatory Visit (LOCAL_COMMUNITY_HEALTH_CENTER): Payer: Self-pay | Admitting: Nurse Practitioner

## 2021-12-02 VITALS — BP 115/78 | Ht 61.0 in | Wt 157.2 lb

## 2021-12-02 DIAGNOSIS — Z3202 Encounter for pregnancy test, result negative: Secondary | ICD-10-CM

## 2021-12-02 DIAGNOSIS — Z113 Encounter for screening for infections with a predominantly sexual mode of transmission: Secondary | ICD-10-CM

## 2021-12-02 DIAGNOSIS — B009 Herpesviral infection, unspecified: Secondary | ICD-10-CM

## 2021-12-02 DIAGNOSIS — Z3009 Encounter for other general counseling and advice on contraception: Secondary | ICD-10-CM

## 2021-12-02 DIAGNOSIS — Z3042 Encounter for surveillance of injectable contraceptive: Secondary | ICD-10-CM

## 2021-12-02 LAB — WET PREP FOR TRICH, YEAST, CLUE
Trichomonas Exam: NEGATIVE
Yeast Exam: NEGATIVE

## 2021-12-02 LAB — HM HIV SCREENING LAB: HM HIV Screening: NEGATIVE

## 2021-12-02 LAB — PREGNANCY, URINE: Preg Test, Ur: NEGATIVE

## 2021-12-02 MED ORDER — MEDROXYPROGESTERONE ACETATE 150 MG/ML IM SUSP
150.0000 mg | INTRAMUSCULAR | Status: AC
  Administered 2021-12-02: 150 mg via INTRAMUSCULAR

## 2021-12-02 MED ORDER — ACYCLOVIR 400 MG PO TABS: 400.0000 mg | ORAL_TABLET | Freq: Three times a day (TID) | ORAL | 0 refills | Status: DC

## 2021-12-02 NOTE — Progress Notes (Signed)
Pt here for PE, Pap and birth control.  Pt notified of negative PT results. Wet mount results reviewed, no treatment required per SO.  Depo 150 mg given IM without any complications.  Pt given reminder card to return in 11-13 weeks for next Depo.  Berdie Ogren, RN ? ? ?

## 2021-12-02 NOTE — Progress Notes (Signed)
[facility] DEPARTMENT ?Family Planning Clinic ?[address] [employer] ?Main Number: [phone] ? ? ? ?Family Planning Visit- Initial Visit ? ?Subjective:  ?Alejandra Coleman is a 41 y.o.  [id number]   being seen today for an initial annual visit and to discuss reproductive life planning.  The patient is currently using Hormonal Injection for pregnancy prevention. Patient reports   does not want a pregnancy in the next year.   ? ? report they are looking for a method that provides High efficacy at preventing pregnancy ? ?Patient has the following medical conditions has Vapes nicotine containing substance and Overweight on their problem list. ? ?Chief Complaint  ?Patient presents with  ? Contraception  ?  PE and Depo  ? ? ?Patient reports to clinic today for a physical and continuation of birth control. ? ? ? ?Body mass index is 29.7 kg/m?. - Patient is eligible for diabetes screening based on BMI and age >53?  not applicable ?HA1C ordered? not applicable ? ?Patient reports 1  partner/s in last year. Desires STI screening?  Yes ? ?Has patient been screened once for HCV in the past?  No ? No results found for: HCVAB ? ?Does the patient have current drug use (including MJ), have a partner with drug use, and/or has been incarcerated since last result? No  ?If yes-- Screen for HCV through [facility] ?  ?Does the patient meet criteria for HBV testing? No ? ?Criteria:  ?-Household, sexual or needle sharing contact with HBV ?-History of drug use ?-HIV positive ?-Those with known Hep C ? ? ?Health Maintenance Due  ?Topic Date Due  ? INFLUENZA VACCINE  Never done  ? PAP SMEAR-Modifier  01/31/2022  ? ? ?Review of Systems  ?Constitutional:  Negative for chills, fever, malaise/fatigue and weight loss.  ?HENT:  Negative for congestion, hearing loss and sore throat.   ?Eyes:  Negative for blurred vision, double vision and photophobia.  ?Respiratory:  Negative for shortness of breath.   ?Cardiovascular:   Negative for chest pain.  ?Gastrointestinal:  Negative for abdominal pain, blood in stool, constipation, diarrhea, heartburn, nausea and vomiting.  ?Genitourinary:  Negative for dysuria and frequency.  ?Musculoskeletal:  Negative for back pain, joint pain and neck pain.  ?Skin:  Negative for itching and rash.  ?Neurological:  Negative for dizziness, weakness and headaches.  ?Endo/Heme/Allergies:  Does not bruise/bleed easily.  ?Psychiatric/Behavioral:  Negative for depression, substance abuse and suicidal ideas.   ? ?The following portions of the patient's history were reviewed and updated as appropriate: allergies, current medications, past family history, past medical history, past social history, past surgical history and problem list. Problem list updated. ? ? ?See flowsheet for other program required questions. ? ?Objective:  ? ?Vitals:  ? 12/02/21 1413  ?BP: 115/78  ?Weight: 157 lb 3.2 oz (71.3 kg)  ?Height: 5\' 1"  (1.549 m)  ? ? ?Physical Exam ?Constitutional:   ?   Appearance: Normal appearance.  ?HENT:  ?   Head: Normocephalic.  ?   Right Ear: External ear normal.  ?   Left Ear: External ear normal.  ?   Nose: Nose normal.  ?   Mouth/Throat:  ?   Mouth: Mucous membranes are moist.  ?   Comments: No visible dental caries.  ?Eyes:  ?   Pupils: Pupils are equal, round, and reactive to light.  ?Cardiovascular:  ?   Rate and Rhythm: Normal rate and regular rhythm.  ?Pulmonary:  ?  Effort: Pulmonary effort is normal.  ?   Breath sounds: Normal breath sounds.  ?Chest:  ?   Comments: Breasts:  ?      Right: Normal. No swelling, mass, nipple discharge, skin change or tenderness.  ?      Left: Normal. No swelling, mass, nipple discharge, skin change or tenderness.   ?Abdominal:  ?   General: Abdomen is flat. Bowel sounds are normal.  ?   Palpations: Abdomen is soft.  ?Genitourinary: ?   Comments: Small macule painful lesion on right labia and perineum area.  ?Musculoskeletal:  ?   Cervical back: Full passive range of  motion without pain, normal range of motion and neck supple.  ?Skin: ?   General: Skin is warm and dry.  ?Neurological:  ?   Mental Status: She is alert and oriented to person, place, and time.  ?Psychiatric:     ?   Attention and Perception: Attention normal.     ?   Mood and Affect: Mood normal.     ?   Speech: Speech normal.     ?   Behavior: Behavior is cooperative.  ? ? ? ? ?Assessment and Plan:  ?Alejandra Coleman is a 41 y.o. female [facility] for an initial annual wellness/contraceptive visit ? ?Contraception counseling: Reviewed options based on patient desire and reproductive life plan. Patient is interested in Hormonal Injection. This was provided to the patient today.  ? ?Risks, benefits, and typical effectiveness rates were reviewed.  Questions were answered.  Written information was also given to the patient to review.   ? ?The patient will follow up in  1 years for surveillance.  The patient was told to call with any further questions, or with any concerns about this method of contraception.  Emphasized use of condoms 100% of the time for STI prevention. ? ?Patient was offered ECP based on last sexual encounter.   ? ?1. Family planning counseling ?-41 year old female in clinic today for physical and Depo. ?-PAP and CBE due today.  ?- IGP, Aptima HPV ?- Pregnancy, urine ? ?2. Screening examination for venereal disease ?-Patient agrees to STD screening today. ?Patient accepted all screenings including oral, vaginal CT/GC and bloodwork for HIV/RPR.  ?Patient meets criteria for HepB screening? No. Ordered? No - low risk ?Patient meets criteria for HepC screening? No. Ordered? No - low risk  ? ?Treat wet prep per standing order ?Discussed time line for State Lab results and that patient will be called with positive results and encouraged patient to call if she had not heard in 2 weeks.  ?Counseled to return or seek care for continued or worsening  symptoms ?Recommended condom use with all sex ? ?Patient is currently using Hormonal Contraception: Injection, Rings and Patches to prevent pregnancy.   ?- HIV [place] [facility] ?- Syphilis Serology, [place] [facility] ?- Chlamydia/Gonorrhea [place] [facility] ?- WET PREP FOR TRICH, YEAST, CLUE ?- Virology, [place] [facility] ? ?3. Surveillance for Depo-Provera contraception ?-If PT negative may have Depo 150 MG IM q 11-13 weeks x 1 year.  ?- medroxyPROGESTERone (DEPO-PROVERA) injection 150 mg ? ?4. Herpes ?-Small painful lesions noted to right labia and perineum area.  ?-HSV samples collected today.  Advised not to have sex until test results are received.  ?-Will treat today as initial treat while test results are pending. Acyclovir 400 MG PO TID x 10 days.   ?- acyclovir (ZOVIRAX) 400 MG tablet;  Take 1 tablet (400 mg total) by mouth 3 (three) times daily.  Dispense: 30 tablet; Refill: 0 ? ? ?The patient was dispensed Acyclovir today. I provided counseling today regarding the medication. We discussed the medication, the side effects and when to call clinic. Patient given the opportunity to ask questions. Questions answered.   ? ?Return in about 11 weeks (around 02/17/2022) for Routine DMPA injection. ? ? ?Glenna Fellows, FNP ? ?

## 2021-12-05 ENCOUNTER — Telehealth: Payer: Self-pay

## 2021-12-05 NOTE — Telephone Encounter (Addendum)
Phone call to pt with Spanish language line interpreter.   ? ?Call to pt at [phone]. Received message that number is not in service, unable to leave message. ?Tried twice. ? ?Called alternative pt contact listed in chart, [phone]. Voicemail not set up, unable to leave message. ?Tried twice. ? ?(MyChart is pending) ?

## 2021-12-05 NOTE — Telephone Encounter (Signed)
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 ?Dispensed Acyclovir during 12/02/21 clinic visit. Inform of result and schedule tx appt with provider if desired. ? ? ? ?

## 2021-12-06 LAB — IGP, APTIMA HPV
HPV Aptima: POSITIVE — AB
PAP Smear Comment: 0

## 2021-12-06 NOTE — Telephone Encounter (Signed)
Phone call to pt with Spanish language line interpreter.   ?  ?Call to pt at [phone]. Received message that number is not in service, unable to leave message. ?Tried twice. ?  ?Called alternative pt contact listed in chart, [phone]. Voicemail not set up, unable to leave message. ?Tried twice. ?

## 2021-12-08 NOTE — Progress Notes (Signed)
Chart reviewed by Pharmacist  Suzanne Walker PharmD, Contract Pharmacist at [facility]  

## 2021-12-09 NOTE — Telephone Encounter (Signed)
Phone call to pt with Spanish language line interpreter.   ?  ?Call to pt at [phone]. Left message on voicemail that RN with [facility] is calling regarding TR. Please call Alejandra Coleman at [phone]. ?

## 2021-12-10 ENCOUNTER — Telehealth: Payer: Self-pay

## 2021-12-10 NOTE — Telephone Encounter (Signed)
Telephone call to patient today regarding her PAP results and the need for a Colpo.  PAP Normal and Positive HPV.  Left message to return my call at [phone].  Language Line used today.  Harrison Mons / [phone].  Hart Carwin, RN ? ?

## 2021-12-12 NOTE — Telephone Encounter (Signed)
Phone call to pt with Spanish language line interpreter.   ?  ?Call to pt at [phone]. No longer in service. ?Tried twice. ? ?Phone call to alternate pt contact number at [phone].  Reached someone that stated she was the pt after a few attempts asking to speak to [place], unable to confirm that it was pt. Did not know password, home address was different, did not know which provider had seen her, etc. ? ?Offered to schedule an appt to be seen in clinic and could review results at that time.  ?Person states that they will call back tomorrow to schedule appt once she knows work schedule and/or asks for permission, or maybe she will remember password. ?

## 2021-12-13 NOTE — Telephone Encounter (Signed)
Phone call to pt with interpreter Marlene Yemen. ?Call to [phone]. Unable to leave message. ? ?Phone call to alternate pt contact number at [phone]. Female answered phone, [facility] asked for [place], and female stated we had wrong number. ?

## 2021-12-13 NOTE — Telephone Encounter (Signed)
Telephone call to patient today regarding her PAP results and the need for a Colpo.  Phone states it is no longer in service.  Language Line used today.  Alejandra Coleman / [phone]. Hart Carwin, RN ? ?

## 2021-12-16 ENCOUNTER — Telehealth: Payer: Self-pay | Admitting: Family Medicine

## 2021-12-16 NOTE — Telephone Encounter (Signed)
Phone call to pt with language line interpreter.  ? ?Call to pt at [phone]. Unable to leave message. ?Tried twice. ?  ?Phone call to alternate pt contact number at [phone]. Female answered phone, [facility] asked for [place], and female stated we had wrong number. Discovered that female is the brother in law to pt.  He stated he would try to get [facility]'s number and call him back in few minutes. ? ?Phone call to [phone].  No answer, unable to leave message. ? ?[facility] RN has made several attempts to reach pt by phone. RN to mail letter to home address. ?

## 2021-12-16 NOTE — Telephone Encounter (Signed)
Return call by patient. Colpo referral to [facility] per patient desires.  [facility] referral completed today for + HPV. Language Line used today.  Luanna Salk / [phone].  Dahlia Bailiff, RN ? ?

## 2021-12-16 NOTE — Telephone Encounter (Signed)
Pt was calling regarding her PAP results and Hart Carwin was trying to contact her with the interpreter, but her phone number was not in service. Please call her back. Her phone number has been updated. Thanks. ?

## 2021-12-16 NOTE — Telephone Encounter (Signed)
Return call to patient regarding her PAP results and the need for a Colpo referral.  PAP Normal and HPV Positive.  She desires her Colpo referral to [facility].  [facility] referral completed today. Language Line used today.  Luanna Salk / [phone].  Dahlia Bailiff, RN ? ?

## 2021-12-18 NOTE — Telephone Encounter (Signed)
Unable to reach pt by phone. Certified letter mailed to pt 12/18/21 (see scanned copy). ?

## 2022-01-09 NOTE — Progress Notes (Signed)
PAP letter mailed today.  Repeat PAP and PE due 01-2022. Normal Pap and + HPV 01-31-2021.  Hart Carwin, RN ? ?

## 2022-01-15 ENCOUNTER — Ambulatory Visit: Payer: Self-pay | Attending: Hematology and Oncology

## 2022-01-15 ENCOUNTER — Ambulatory Visit: Payer: Self-pay

## 2022-01-15 ENCOUNTER — Ambulatory Visit: Payer: Self-pay | Admitting: Obstetrics and Gynecology

## 2022-01-15 ENCOUNTER — Telehealth: Payer: Self-pay | Admitting: Family Medicine

## 2022-01-15 NOTE — Telephone Encounter (Signed)
Pt. Would like to speak to a nurse to see if you can help her change her appt at [facility].  She has an appt today at 1:00 but has been trying to change the appt for the last couple of days but she said they never answer her calls. ?

## 2022-01-21 ENCOUNTER — Ambulatory Visit: Payer: Self-pay | Attending: Hematology and Oncology | Admitting: *Deleted

## 2022-01-21 ENCOUNTER — Encounter (INDEPENDENT_AMBULATORY_CARE_PROVIDER_SITE_OTHER): Payer: Self-pay

## 2022-01-21 DIAGNOSIS — R87618 Other abnormal cytological findings on specimens from cervix uteri: Secondary | ICD-10-CM

## 2022-01-21 NOTE — Progress Notes (Signed)
[name] is a 41 y.o. female who presents to [facility] clinic today with complaint of recent abnormal pap on 12/02/21 and need for colposcopy. Last pap was negative /HPV positive with previous pap on 01/31/21 of negative / HPV positive.  Per ASCCP guidelines colposcopy would be indicated.  ?  ?Pap Smear: Pap not smear completed today. Last Pap smear was 12/02/21 at the [facility] clinic and was normal but HPV positive.   Per patient has history of an abnormal Pap smear. Last Pap smear result is available in Epic. ?  ?Physical exam: declined - cbe on 12/02/21 ?Breasts ? ?Pelvic/Bimanual ?Pap is not indicated today  ?  ?Smoking History: ?Patient has never smoked. Not referred to quit line.  ?  ?Patient Navigation: ?Patient education provided on abnormal paps and colpsocopy. Access to services provided for patient through [facility] program. Alejandra Coleman, the interpreter provided interpretation.  No transportation provided  ? ?Colorectal Cancer Screening: ?Per patient has never had colonoscopy completed and does not meet age requirements for colorectal screening at this time. No complaints today.  ?  ?Breast and Cervical Cancer Risk Assessment: ?Patient does not have family history of breast cancer, known genetic mutations, or radiation treatment to the chest before age 27. Patient does not have history of cervical dysplasia, immunocompromised, or DES exposure in-utero. ? ?Risk Assessment   ?No risk assessment data ?  ? ?Risk Assessment   ? ? Risk Scores   ? ?   01/21/2022  ? Last edited by: Lesle Chris, RN  ? 5-year risk: 0.3 %  ? Lifetime risk: 7.2 %  ? ?  ?  ? ?  ?  ? ?A: ?BCCCP exam without pap smear ? ? ?P: ?Referred patient to [facility] for colposcopy .  Appointment scheduled for 02/05/22 @ 11:15. ? ?Jim Like, RN ?01/21/2022 3:00 PM   ?

## 2022-01-29 ENCOUNTER — Other Ambulatory Visit: Payer: Self-pay

## 2022-01-29 DIAGNOSIS — Z1231 Encounter for screening mammogram for malignant neoplasm of breast: Secondary | ICD-10-CM

## 2022-02-05 ENCOUNTER — Ambulatory Visit: Payer: Self-pay | Admitting: Obstetrics and Gynecology

## 2022-02-24 ENCOUNTER — Ambulatory Visit: Payer: Self-pay | Admitting: Family Medicine

## 2022-03-25 ENCOUNTER — Other Ambulatory Visit (HOSPITAL_COMMUNITY)
Admission: RE | Admit: 2022-03-25 | Discharge: 2022-03-25 | Disposition: A | Discharge status: Home / Self Care | Payer: Self-pay | Source: Ambulatory Visit | Attending: Obstetrics and Gynecology | Admitting: Obstetrics and Gynecology

## 2022-03-25 ENCOUNTER — Ambulatory Visit (INDEPENDENT_AMBULATORY_CARE_PROVIDER_SITE_OTHER): Payer: Self-pay | Admitting: Family Medicine

## 2022-03-25 ENCOUNTER — Encounter: Payer: Self-pay | Admitting: Family Medicine

## 2022-03-25 VITALS — Wt 165.0 lb

## 2022-03-25 DIAGNOSIS — B977 Papillomavirus as the cause of diseases classified elsewhere: Secondary | ICD-10-CM

## 2022-03-25 DIAGNOSIS — R87618 Other abnormal cytological findings on specimens from cervix uteri: Secondary | ICD-10-CM

## 2022-03-25 LAB — POCT URINE PREGNANCY: Preg Test, Ur: NEGATIVE

## 2022-03-25 NOTE — Progress Notes (Signed)
    BCCCP Referral  GYNECOLOGY OFFICE COLPOSCOPY PROCEDURE NOTE  41 y.o. [id number] here for colposcopy for  NIL, HPV POS  pap smear on 12/02/21 (previous pap was also NIL HPV pos 01/31/2021). Discussed role for HPV in cervical dysplasia, need for surveillance. Reviewed results and possible outcomes of biopsies.   Wt 165 lb (74.8 kg)   BMI 31.18 kg/m    UPT was negative, patient uses depo for contraception  Patient gave informed written consent, time out was performed.  Placed in lithotomy position. Cervix viewed with speculum and colposcope after application of acetic acid.   Physical Exam Exam conducted with a chaperone present.  Genitourinary:    Vagina: Normal.     Cervix: Normal.        Colposcopy adequate? Yes Visible acetowhite changes at 4-7 o'clock and at 12 oclock. Punctation at 6 oclock corresponding biopsies obtained.  ECC specimen obtained. All specimens were labeled and sent to pathology.  Chaperone was present during entire procedure.  Patient was given post procedure instructions.  Will follow up pathology and manage accordingly; patient will be contacted with results and recommendations.  Routine preventative health maintenance measures emphasized.  Federico Flake, MD, MPH, ABFM, [facility] Attending Physician [facility]

## 2022-03-27 ENCOUNTER — Encounter: Payer: Self-pay | Admitting: Family Medicine

## 2022-03-27 LAB — SURGICAL PATHOLOGY

## 2022-03-31 ENCOUNTER — Encounter: Payer: Self-pay | Admitting: Family Medicine

## 2022-03-31 NOTE — Progress Notes (Signed)
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 Repeat pap in 1 year   Repeat PAP in 1 year due 03-2023 per Lyndel Safe, MD.  Hart Carwin, RN

## 2022-04-30 ENCOUNTER — Ambulatory Visit: Payer: Self-pay

## 2022-05-30 ENCOUNTER — Emergency Department
Admission: EM | Admit: 2022-05-30 | Discharge: 2022-05-30 | Disposition: A | Discharge status: Home / Self Care | Payer: Self-pay | Attending: Emergency Medicine | Admitting: Emergency Medicine

## 2022-05-30 ENCOUNTER — Other Ambulatory Visit: Payer: Self-pay

## 2022-05-30 ENCOUNTER — Ambulatory Visit: Payer: Self-pay | Attending: Obstetrics and Gynecology

## 2022-05-30 DIAGNOSIS — R1032 Left lower quadrant pain: Secondary | ICD-10-CM | POA: Insufficient documentation

## 2022-05-30 DIAGNOSIS — N9489 Other specified conditions associated with female genital organs and menstrual cycle: Secondary | ICD-10-CM | POA: Insufficient documentation

## 2022-05-30 DIAGNOSIS — N939 Abnormal uterine and vaginal bleeding, unspecified: Secondary | ICD-10-CM | POA: Insufficient documentation

## 2022-05-30 DIAGNOSIS — Z3202 Encounter for pregnancy test, result negative: Secondary | ICD-10-CM | POA: Insufficient documentation

## 2022-05-30 LAB — COMPREHENSIVE METABOLIC PANEL
ALT: 21 U/L (ref 0–44)
AST: 17 U/L (ref 15–41)
Albumin: 3.9 g/dL (ref 3.5–5.0)
Alkaline Phosphatase: 49 U/L (ref 38–126)
Anion gap: 8 (ref 5–15)
BUN: 9 mg/dL (ref 6–20)
CO2: 23 mmol/L (ref 22–32)
Calcium: 8.8 mg/dL — ABNORMAL LOW (ref 8.9–10.3)
Chloride: 107 mmol/L (ref 98–111)
Creatinine, Ser: 0.54 mg/dL (ref 0.44–1.00)
GFR, Estimated: >60 mL/min (ref >60)
Glucose, Bld: 89 mg/dL (ref 70–99)
Potassium: 3.5 mmol/L (ref 3.5–5.1)
Sodium: 138 mmol/L (ref 135–145)
Total Bilirubin: 0.7 mg/dL (ref 0.3–1.2)
Total Protein: 7.1 g/dL (ref 6.5–8.1)

## 2022-05-30 LAB — CBC WITH DIFFERENTIAL/PLATELET
Abs Immature Granulocytes: 0.03 10*3/uL (ref 0.00–0.07)
Basophils Absolute: 0 10*3/uL (ref 0.0–0.1)
Basophils Relative: 0 %
Eosinophils Absolute: 0.1 10*3/uL (ref 0.0–0.5)
Eosinophils Relative: 1 %
HCT: 38.5 % (ref 36.0–46.0)
Hemoglobin: 13 g/dL (ref 12.0–15.0)
Immature Granulocytes: 1 %
Lymphocytes Relative: 28 %
Lymphs Abs: 1.7 10*3/uL (ref 0.7–4.0)
MCH: 29.9 pg (ref 26.0–34.0)
MCHC: 33.8 g/dL (ref 30.0–36.0)
MCV: 88.5 fL (ref 80.0–100.0)
Monocytes Absolute: 0.4 10*3/uL (ref 0.1–1.0)
Monocytes Relative: 6 %
Neutro Abs: 4 10*3/uL (ref 1.7–7.7)
Neutrophils Relative %: 64 %
Platelets: 244 10*3/uL (ref 150–400)
RBC: 4.35 MIL/uL (ref 3.87–5.11)
RDW: 11.9 % (ref 11.5–15.5)
WBC: 6.3 10*3/uL (ref 4.0–10.5)
nRBC: 0 % (ref 0.0–0.2)

## 2022-05-30 LAB — HCG, QUANTITATIVE, PREGNANCY: hCG, Beta Chain, Quant, S: 3 m[IU]/mL (ref <5)

## 2022-05-30 LAB — POC URINE PREG, ED: Preg Test, Ur: NEGATIVE

## 2022-05-30 LAB — ABO/RH: ABO/RH(D): A POS

## 2022-05-30 NOTE — ED Provider Notes (Signed)
[facility] Provider Note    Event Date/Time   First MD Initiated Contact with Patient 05/30/22 1209     (approximate)   History   Vaginal Bleeding   HPI  Alejandra Coleman is a 41 y.o. female who states she had a positive pregnancy test 2 weeks ago and her last LMP was in July, presents emergency department with vaginal bleeding.  States it is not very heavy and is not having to wear a pad but has noticed it on the toilet paper.  Patient has history of ectopic pregnancy, she has 2 children, she is having left lower quadrant pain.  This is the same side where she had the ectopic previously.  She denies fever, chills, vaginal discharge     Physical Exam   Triage Vital Signs: ED Triage Vitals  Enc Vitals Group     BP 05/30/22 1158 132/82     Pulse Rate 05/30/22 1158 (!) 55     Resp 05/30/22 1158 18     Temp 05/30/22 1158 98.3 F (36.8 C)     Temp Source 05/30/22 1158 Oral     SpO2 05/30/22 1158 96 %     Weight 05/30/22 1159 158 lb (71.7 kg)     Height 05/30/22 1159 5\' 1"  (1.549 m)     Head Circumference --      Peak Flow --      Pain Score 05/30/22 1159 2     Pain Loc --      Pain Edu? --      Excl. in [place]? --     Most recent vital signs: Vitals:   05/30/22 1158  BP: 132/82  Pulse: (!) 55  Resp: 18  Temp: 98.3 F (36.8 C)  SpO2: 96%     General: Awake, no distress.   CV:  Good peripheral perfusion. regular rate and  rhythm Resp:  Normal effort.  Abd:  No distention.  Tender in the left lower quadrant Other:      ED Results / Procedures / Treatments   Labs (all labs ordered are listed, but only abnormal results are displayed) Labs Reviewed  COMPREHENSIVE METABOLIC PANEL - Abnormal; Notable for the following components:      Result Value   Calcium 8.8 (*)    All other components within normal limits  HCG, QUANTITATIVE, PREGNANCY  CBC WITH DIFFERENTIAL/PLATELET  POC URINE PREG, ED  ABO/RH      EKG     RADIOLOGY     PROCEDURES:   Procedures   MEDICATIONS ORDERED IN ED: Medications - No data to display   IMPRESSION / MDM / ASSESSMENT AND PLAN / ED COURSE  I reviewed the triage vital signs and the nursing notes.                              Differential diagnosis includes, but is not limited to, ectopic pregnancy, false positive pregnancy test, miscarriage, subchorionic hemorrhage  Patient's presentation is most consistent with acute presentation with potential threat to life or bodily function.    Poc pregnancy test is negative, however we will confirm with the beta-hCG.  I did explain to the patient that if the beta-hCG is also less than 1 then she did not have a pregnancy and this would be a normal initial cycle.  If at that time the beta-hCG does come back elevated I do feel we need to do an  ultrasound to assess for ectopic pregnancy.   The patient's beta-hCG is less than 3 so feel that she had a false positive pregnancy test at home.  Due to the scant amount of bleeding she should follow-up with her regular doctor.  Due to her being 41 years old she may be perimenopausal.  She is to return emergency department if worsening abdominal pain or heavier bleeding where she is bleeding through 1-2 pads per hour.  All information was conveyed via the interpreter.  Patient was discharged stable condition.   FINAL CLINICAL IMPRESSION(S) / ED DIAGNOSES   Final diagnoses:  Vaginal bleeding  Negative pregnancy test     Rx / DC Orders   ED Discharge Orders     None        Note:  This document was prepared using Dragon voice recognition software and may include unintentional dictation errors.    Faythe Ghee, PA-C 05/30/22 1400    Arnaldo Natal, MD 05/30/22 1451

## 2022-05-30 NOTE — Discharge Instructions (Signed)
Follow up with your regular doctor, please call for an appointment Return to the ER if worsening

## 2022-05-30 NOTE — ED Triage Notes (Signed)
Pt states she took a pregnancy test a week ago that was positive and this morning reports heavy vaginal bleeding with clots. Pt reports 2 pads/hour. Pt is AOX4, in NAD. Pt c/o mild pelvic cramping.

## 2022-06-04 ENCOUNTER — Encounter: Payer: Self-pay | Admitting: Emergency Medicine

## 2022-06-04 ENCOUNTER — Emergency Department
Admission: EM | Admit: 2022-06-04 | Discharge: 2022-06-04 | Disposition: A | Discharge status: Home / Self Care | Payer: Self-pay | Attending: Emergency Medicine | Admitting: Emergency Medicine

## 2022-06-04 ENCOUNTER — Other Ambulatory Visit: Payer: Self-pay

## 2022-06-04 DIAGNOSIS — U071 COVID-19: Secondary | ICD-10-CM | POA: Insufficient documentation

## 2022-06-04 MED ORDER — ONDANSETRON 4 MG PO TBDP: 4.0000 mg | ORAL_TABLET | Freq: Three times a day (TID) | ORAL | 0 refills | Status: AC | PRN

## 2022-06-04 MED ORDER — BENZONATATE 100 MG PO CAPS: 100.0000 mg | ORAL_CAPSULE | Freq: Three times a day (TID) | ORAL | 0 refills | Status: AC | PRN

## 2022-06-04 NOTE — ED Triage Notes (Signed)
C/O cough, congestion, fever, since Friday. STates home covid test postiive today.

## 2022-06-04 NOTE — Discharge Instructions (Addendum)
Take tylenol 1g every 8 hours for pain/fevers. Take medications to help with symptoms.

## 2022-06-04 NOTE — ED Provider Notes (Signed)
   [facility] Provider Note    None    (approximate)   History   Covid Positive   HPI  Alejandra Coleman is a 41 y.o. female  who is otherwise healthy who comes in with cough, congestion, fever since Friday. Did have positive home test covid today. Does not take any medications. Vaccinated for covid x 1 shot. She has taken tylenol for symptoms. She reports already feeling better then the other days. LMP 15th of the month. Denies concerns for pregnancy.    Physical Exam   Triage Vital Signs: ED Triage Vitals [06/04/22 0956]  Enc Vitals Group     BP      Pulse      Resp      Temp      Temp src      SpO2      Weight 157 lb 13.6 oz (71.6 kg)     Height 5\' 1"  (1.549 m)     Head Circumference      Peak Flow      Pain Score 0     Pain Loc      Pain Edu?      Excl. in [place]?     Most recent vital signs: There were no vitals filed for this visit.   General: Awake, no distress.  CV:  Good peripheral perfusion.  Resp:  Normal effort. No course breath sounds  Abd:  No distention. Soft and non tender  Other:  NO swelling inside mouth    ED Results / Procedures / Treatments   Labs (all labs ordered are listed, but only abnormal results are displayed) Labs Reviewed - No data to display   PROCEDURES:  Critical Care performed: No  Procedures   MEDICATIONS ORDERED IN ED: Medications - No data to display   IMPRESSION / MDM / [place] / ED COURSE  I reviewed the triage vital signs and the nursing notes.   Patient's presentation is most consistent with acute, uncomplicated illness.   Pt with covid- low suspicion for PNA  - pt declined xray. XRAy without evidence of PTA/RPA/strep throat.   Reviewed office visit from 11/2021 -on injection for Bc   Out of window for paxlovid and low risk with no risk factors so declined rx. Will rx symptomatically.    FINAL CLINICAL IMPRESSION(S) / ED DIAGNOSES   Final diagnoses:   COVID-19     Rx / DC Orders   ED Discharge Orders          Ordered    benzonatate (TESSALON PERLES) 100 MG capsule  3 times daily PRN        06/04/22 1007    ondansetron (ZOFRAN-ODT) 4 MG disintegrating tablet  Every 8 hours PRN        06/04/22 1007             Note:  This document was prepared using Dragon voice recognition software and may include unintentional dictation errors.   Vanessa Scott, MD 06/04/22 [phone]

## 2022-06-11 ENCOUNTER — Ambulatory Visit: Payer: Self-pay

## 2022-08-20 IMAGING — CR DG CHEST 2V
1 series · 2 of 2 positions shown · non-contrast
Comparison: None

CLINICAL DATA: Cough, fever, chills, and body aches for past week,
97EXS-RP exposure

EXAM:
CHEST - 2 VIEW

[Series 1: w chest pa · 0.14mm/px · 2 of 2 slices shown]
[im 1/2]
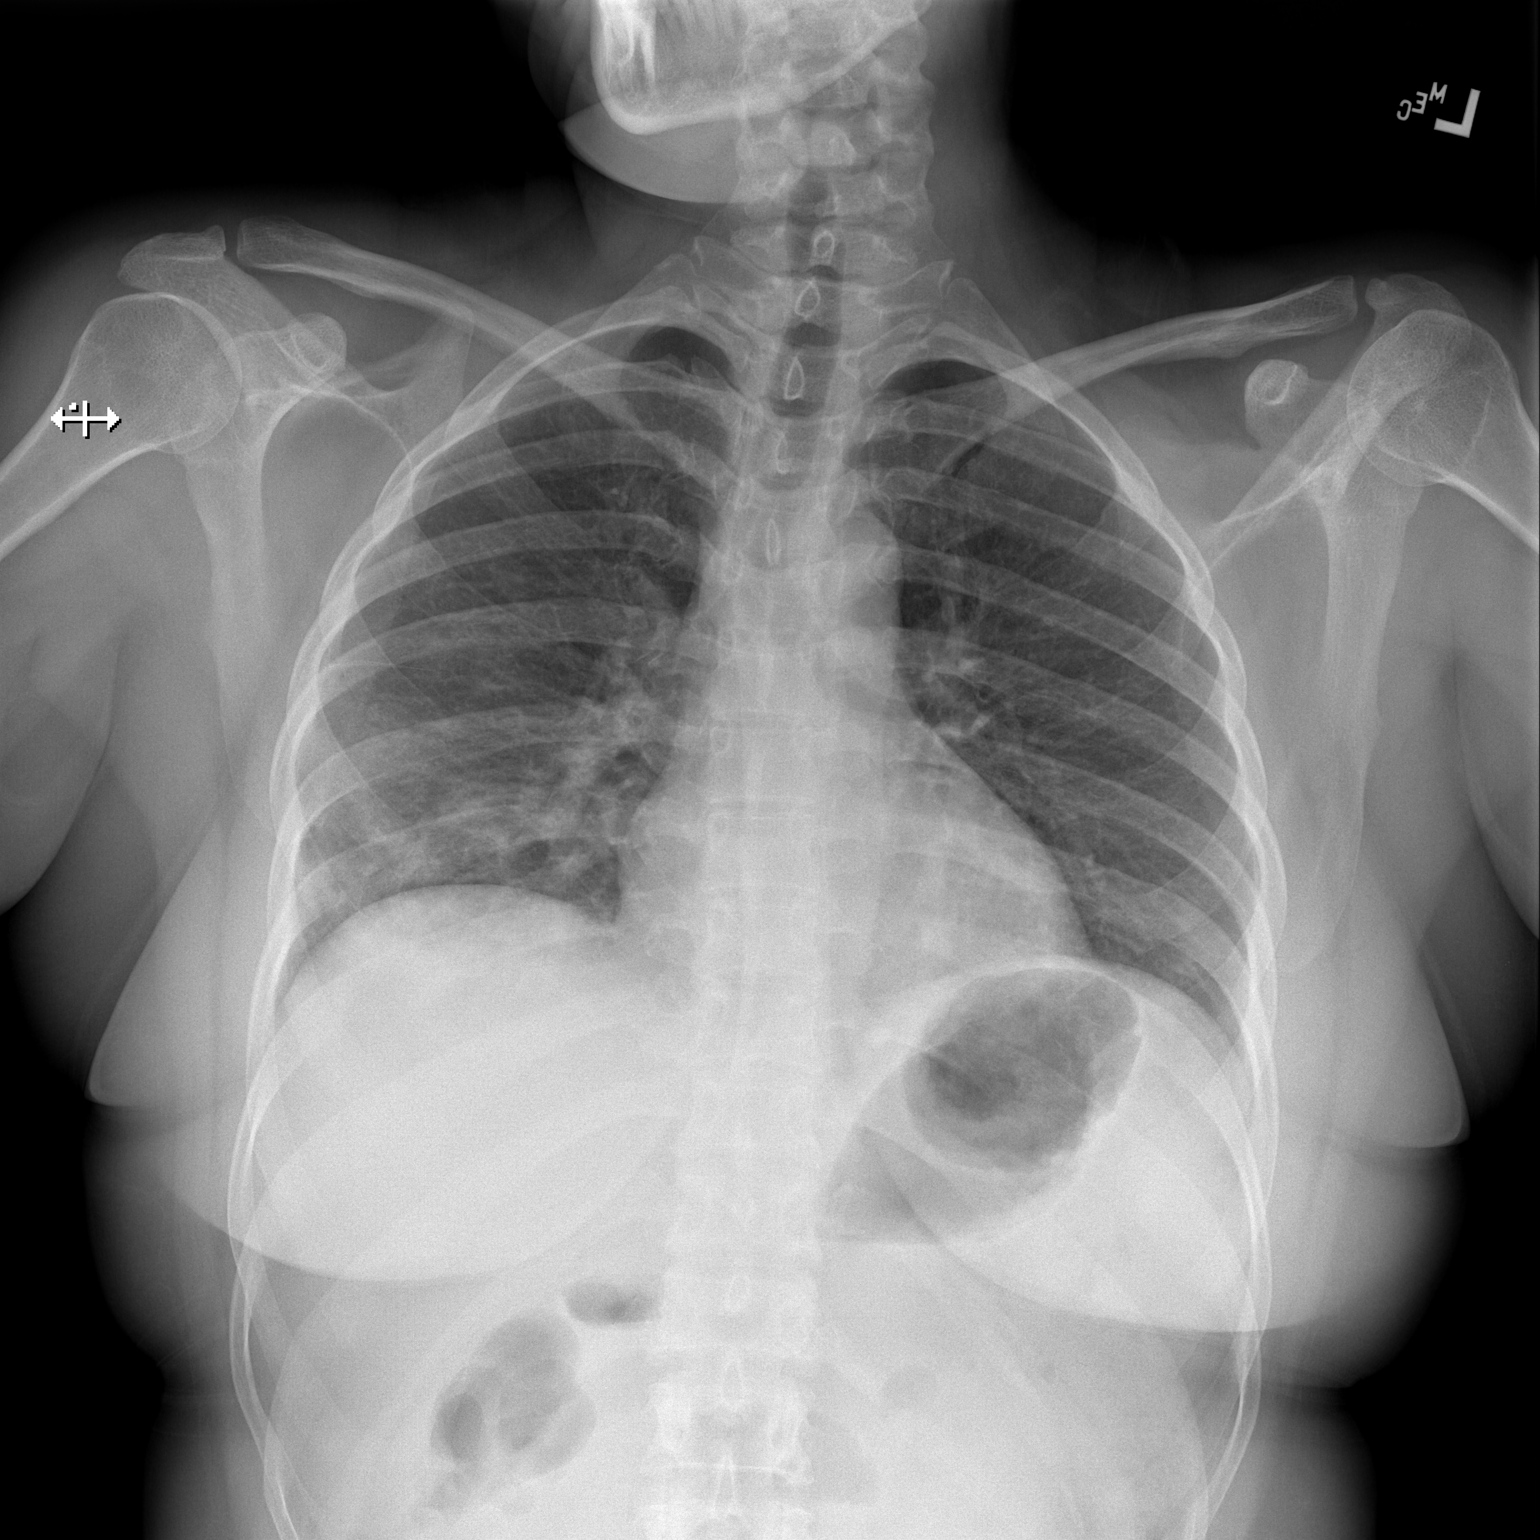
[im 2/2]
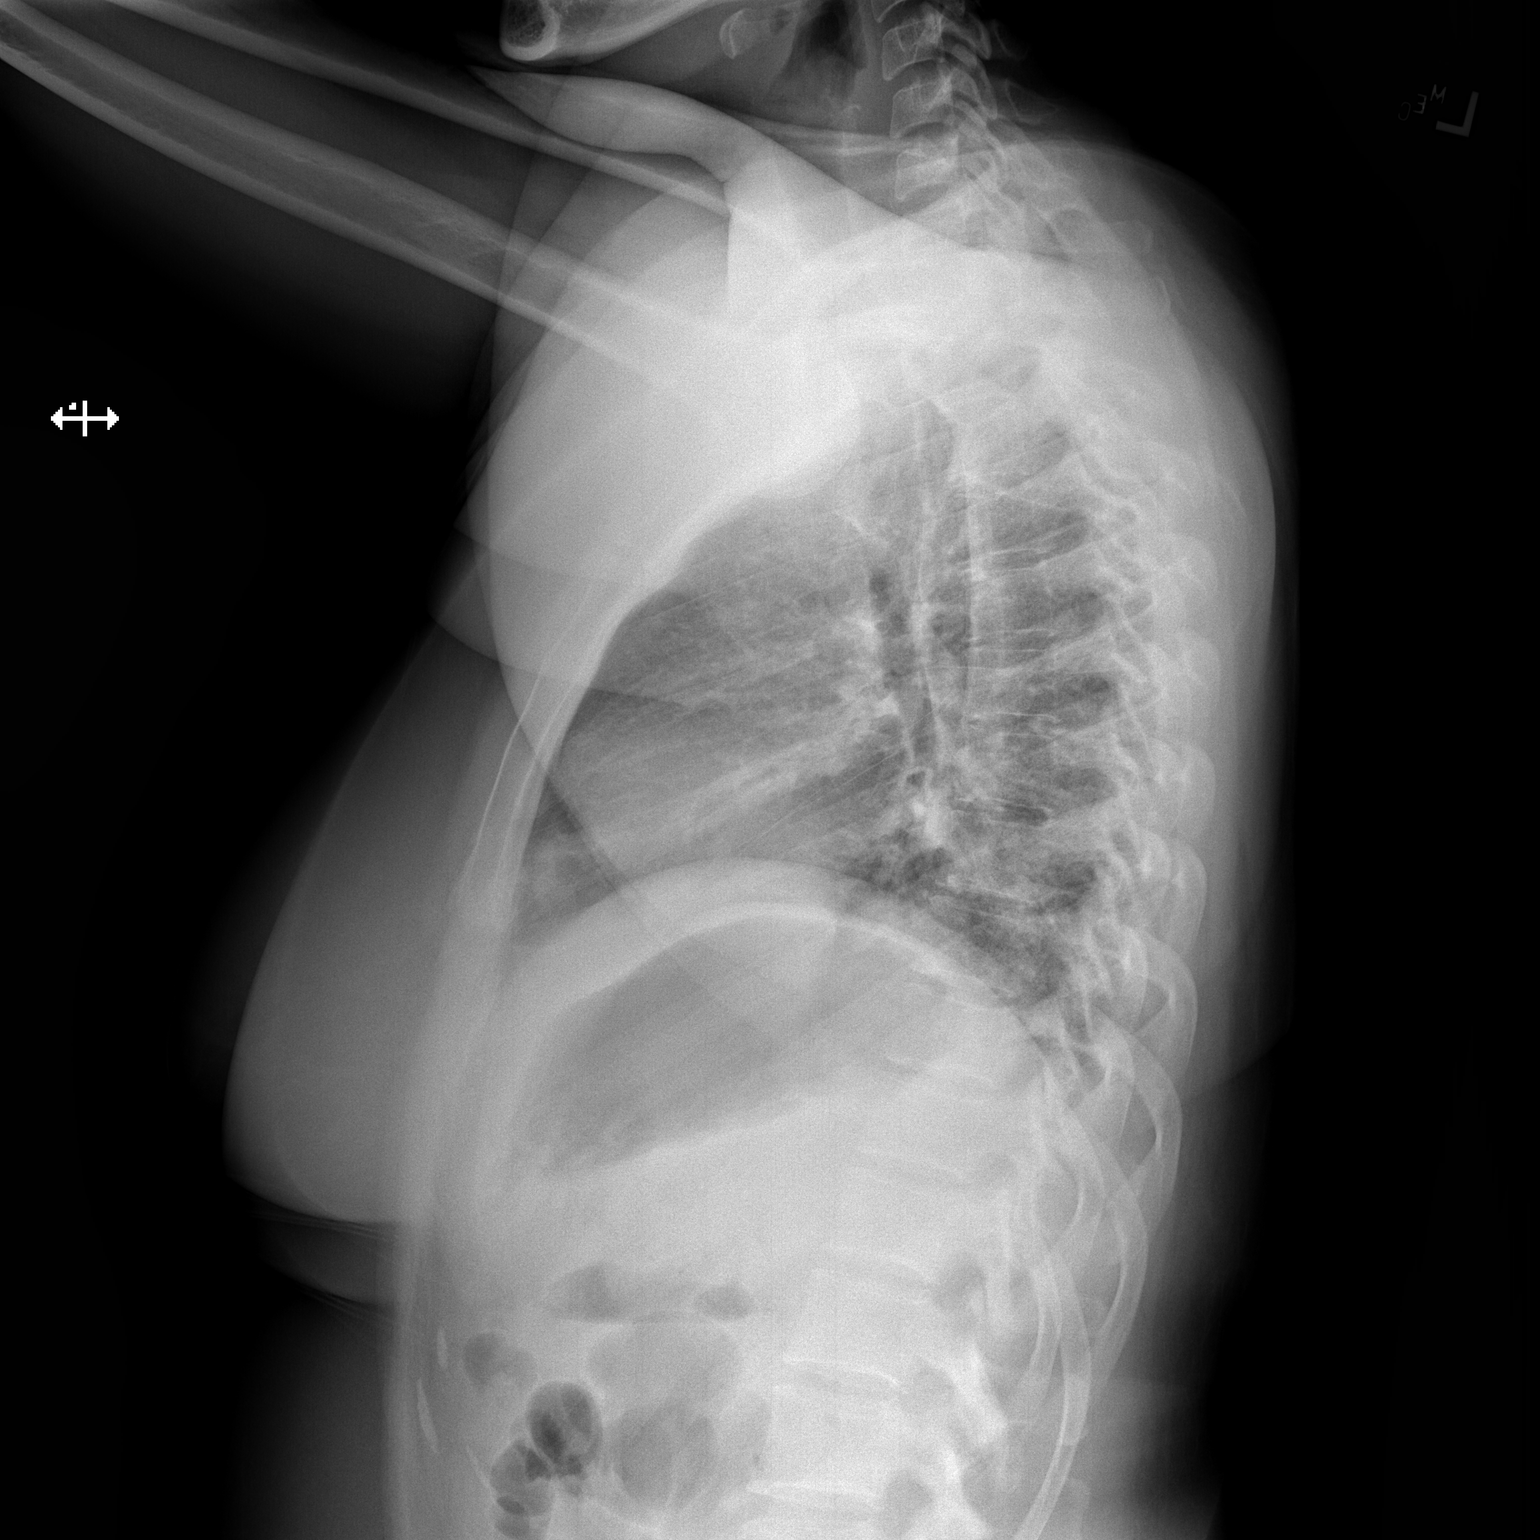

[2 of 2 positions shown; findings below may reference images not displayed]

FINDINGS: Normal heart size, mediastinal contours, and pulmonary vascularity.

RIGHT basilar infiltrate consistent with pneumonia.

Minimal central peribronchial thickening.

Remaining lungs clear.

No pleural effusion or pneumothorax.

Osseous structures unremarkable.
IMPRESSION: Bronchitic changes with RIGHT basilar infiltrate consistent with
pneumonia

## 2022-10-08 ENCOUNTER — Ambulatory Visit: Payer: Self-pay

## 2023-05-11 ENCOUNTER — Ambulatory Visit (LOCAL_COMMUNITY_HEALTH_CENTER): Payer: Self-pay

## 2023-05-11 VITALS — BP 116/70 | Ht 61.0 in | Wt 164.0 lb

## 2023-05-11 DIAGNOSIS — Z3009 Encounter for other general counseling and advice on contraception: Secondary | ICD-10-CM

## 2023-05-11 DIAGNOSIS — Z30012 Encounter for prescription of emergency contraception: Secondary | ICD-10-CM

## 2023-05-11 MED ORDER — ELLA 30 MG PO TABS: 1.0000 | ORAL_TABLET | Freq: Once | ORAL | Status: AC

## 2023-05-11 NOTE — Progress Notes (Signed)
In nurse clinic for ECP. Interpreter: I Ebbie Ridge Last unprotected sex : today (05/11/23) Sex before today was 2.5 months ago. LMP 04/30/23  BMI 30.99 No health insurance Per SO Dr Lorrin Mais, eligible for Samson Frederic (ECP)  ECP consent signed ECP info sheet given and reviewed  The patient was dispensed Samson Frederic  today per SO Dr Lorrin Mais. I provided counseling today regarding the medication. We discussed the medication, the side effects and when to call clinic. Patient given the opportunity to ask questions. Questions answered.    Questions answered and reports understanding. Jerel Shepherd, RN

## 2023-06-19 ENCOUNTER — Ambulatory Visit: Payer: Self-pay | Admitting: Family Medicine

## 2023-06-19 ENCOUNTER — Ambulatory Visit (LOCAL_COMMUNITY_HEALTH_CENTER): Payer: Self-pay | Admitting: Family Medicine

## 2023-06-19 ENCOUNTER — Encounter: Payer: Self-pay | Admitting: Family Medicine

## 2023-06-19 VITALS — BP 112/79 | HR 71 | Ht 61.5 in | Wt 159.0 lb

## 2023-06-19 DIAGNOSIS — Z3009 Encounter for other general counseling and advice on contraception: Secondary | ICD-10-CM

## 2023-06-19 DIAGNOSIS — Z113 Encounter for screening for infections with a predominantly sexual mode of transmission: Secondary | ICD-10-CM

## 2023-06-19 DIAGNOSIS — B009 Herpesviral infection, unspecified: Secondary | ICD-10-CM

## 2023-06-19 LAB — HM HIV SCREENING LAB: HM HIV Screening: NEGATIVE

## 2023-06-19 LAB — HM HEPATITIS C SCREENING LAB: HM Hepatitis Screen: NEGATIVE

## 2023-06-19 LAB — HEPATITIS B SURFACE ANTIGEN

## 2023-06-19 MED ORDER — ACYCLOVIR 400 MG PO TABS: 800.0000 mg | ORAL_TABLET | Freq: Two times a day (BID) | ORAL | 0 refills | Status: AC

## 2023-06-19 NOTE — Progress Notes (Signed)

## 2023-06-19 NOTE — Progress Notes (Signed)
[facility] Department  STI clinic/screening visit [address] [place] [address] [phone]  Subjective:  Alejandra Coleman is a pleasant 42 y.o. female, G3P2, being seen today for an STI screening visit. The patient reports they do have symptoms.  Patient reports that they do not desire a pregnancy in the next year. They reported they are not interested in discussing contraception today.    LMP 05/29/23  Patient has the following medical conditions:   Patient Active Problem List   Diagnosis Date Noted   Vapes nicotine containing substance 05/03/2020   Overweight 05/03/2020   CC: Painful bumps to external vaginal area.   HPI  Patient reports bumps to the external vaginal area that appeared 1 week ago. The patient states this is the first time they have appeared and are very painful. She says there are 2 bumps present. She endorses dysuria, painful sex (not painful internally, but painful due to the bumps). She states the bumps are itchy. She has no abdominal pain, bleeding with sex, abnormal vaginal discharge, lesions or rash anywhere else on the body, a sore throat or visual changes. Of note, the patient record indicates on 12/04/21, the patient was diagnosed with HSV 2 and given Acyclovir. The patient denies remembering this and believes the bumps have appeared for the first time ever just one week ago.   Patient endorses last sexual intercourse was 06/12/23 without a condom. She reports never using condoms until now with the bumps and is requesting condoms to take home with her today. She also denies using any other form of contraception, but was previously on depo injections until about 1 year ago. She reports one female partner having vaginal and oral sex.   See below for latest HIV and HCV screening results.  See below for information on PAP history.   Last HIV test per patient/review of record was  Lab Results  Component Value Date   HMHIVSCREEN  Negative - Validated 12/02/2021     Last HEPC test per patient/review of record was  Lab Results  Component Value Date   HMHEPCSCREEN Negative-Validated 02/06/2021   Last PAP Lab Results  Component Value Date   SPECADGYN Comment 12/02/2021  Patient has the following PAP hx: 01/31/21: NILM, HPV (+) 12/04/21: NILM, HPV (+) PAP was performed at today's visit. Please see note on other encounter from today.   Screening for MPX risk: Does the patient have an unexplained rash? No Is the patient MSM? No Does the patient endorse multiple sex partners or anonymous sex partners? No Did the patient have close or sexual contact with a person diagnosed with MPX? No Has the patient traveled outside the [place] where MPX is endemic? No Is there a high clinical suspicion for MPX-- evidenced by one of the following No  -Unlikely to be chickenpox  -Lymphadenopathy  -Rash that present in same phase of evolution on any given body part See flowsheet for further details and programmatic requirements.   Immunization history:  Immunization History  Administered Date(s) Administered   Tdap 09/02/2020     The following portions of the patient's history were reviewed and updated as appropriate: allergies, current medications, past medical history, past social history, past surgical history and problem list.  Objective:  There were no vitals filed for this visit.  Physical Exam Vitals and nursing note reviewed. Exam conducted with a chaperone present [name] Spanish interpreter present as chaperone during PE).  Constitutional:      Appearance:  Normal appearance.  HENT:     Head: Normocephalic and atraumatic.     Salivary Glands: Right salivary gland is not diffusely enlarged or tender. Left salivary gland is not diffusely enlarged or tender.     Comments: Patient with hair pulled up on head.     Mouth/Throat:     Lips: Pink. No lesions.     Mouth: Mucous membranes are moist. No oral lesions.      Tongue: No lesions. Tongue does not deviate from midline.     Palate: No lesions.     Pharynx: Oropharynx is clear. Uvula midline. No oropharyngeal exudate or posterior oropharyngeal erythema.     Tonsils: No tonsillar exudate.  Eyes:     General:        Right eye: No discharge.        Left eye: No discharge.  Pulmonary:     Effort: Pulmonary effort is normal.  Genitourinary:    Exam position: Lithotomy position.     Pubic Area: No rash or pubic lice.      Tanner stage (genital): 5.     Labia:        Right: Tenderness and lesion present. No rash.        Left: No rash or lesion.      Vagina: Normal. No vaginal discharge, erythema, bleeding or lesions.     Cervix: No cervical motion tenderness, discharge, friability, lesion or erythema.     Uterus: Normal.      Adnexa: Right adnexa normal and left adnexa normal.       Comments: pH = <4.5 2 HSV lesions present to external labia. One to the right labia and the other more midline. Both are too small to measure with standard measuring tool. Both are erythematous and slightly raised. They are not oozing, draining, or bleeding. Patient reports they are painful to touch.   Lymphadenopathy:     Head:     Right side of head: No submental, submandibular, tonsillar, preauricular or posterior auricular adenopathy.     Left side of head: No submental, submandibular, tonsillar, preauricular or posterior auricular adenopathy.     Cervical: No cervical adenopathy.     Right cervical: No superficial or posterior cervical adenopathy.    Left cervical: No superficial or posterior cervical adenopathy.     Upper Body:     Right upper body: No supraclavicular or axillary adenopathy.     Left upper body: No supraclavicular or axillary adenopathy.     Lower Body: No right inguinal adenopathy. No left inguinal adenopathy.  Skin:    General: Skin is warm and dry.     Findings: No rash. Lesion: External vaginal area.    Comments: Skin tone appropriate  for ethnicity. Exposed areas only. Multiple tattoos present to arms and hands.   Neurological:     Mental Status: She is alert and oriented to person, place, and time.     GCS: GCS eye subscore is 4. GCS verbal subscore is 5. GCS motor subscore is 6.  Psychiatric:        Attention and Perception: Attention normal.        Mood and Affect: Mood normal.        Speech: Speech normal.        Behavior: Behavior normal. Behavior is cooperative.        Thought Content: Thought content normal.    Assessment and Plan:  Alejandra Coleman is a 42 y.o. female presenting to  the [facility] Department for STI screening  1. Screening for venereal disease  - Chlamydia/Gonorrhea [place] [facility] - HBV Antigen/Antibody [facility] - HIV/HCV [place] [facility] - Syphilis Serology, [place] [facility] - WET PREP FOR TRICH, YEAST, CLUE  Patient accepted all screenings including vaginal CT/GC and bloodwork for HIV/RPR, and wet prep. Patient meets criteria for HepB screening? Yes. Ordered? yes Patient meets criteria for HepC screening? Yes. Ordered? yes  Treat wet prep per standing order Discussed time line for State Lab results and that patient will be called with positive results and encouraged patient to call if she had not heard in 2 weeks.  Counseled to return or seek care for continued or worsening symptoms Recommended repeat testing in 3 months with positive results. Recommended condom use with all sex  Patient is currently using  no method  to prevent pregnancy. Patient accepted condoms today.    Due to language barrier, a Spanish interpreter [name]) was present during the history-taking and subsequent discussion (and for the physical exam) with this patient.  No follow-ups on file.  Total time with patient 30 minutes.   Marylu Lund L. Idol, FNP-C

## 2023-06-19 NOTE — Progress Notes (Signed)
[facility] DEPARTMENT [facility] [address] Main Number: [phone]  Family Planning Visit- Repeat Yearly Visit  Subjective:  Alejandra Coleman is a pleasant 42 y.o. [id number]  being seen today for an annual wellness visit and to discuss contraception options. The patient is currently using No Method - Other Reason for pregnancy prevention. Patient does not want a pregnancy in the next year. Chart review indicates patient seen at this clinic in August 2024 for ECP.  They report they are not interested in discussing contraception today.   Patient has the following medical problems: has Vapes nicotine containing substance and Overweight on their problem list.  Patient presents concerning lesions to vagina (please see note associated with other STI encounter from today). It was noticed during chart review prior to patient interview that she is past due for her PAP as she has a history in 2022 and 2023 of NILM with high-risk HPV +.  See flowsheet for other program required questions.   Body mass index is 29.56 kg/m. - Patient is eligible for diabetes screening based on BMI> 25 and age >35?  yes HA1C ordered? No - Clinician oversight.   Patient reports 1 of partners in last year. Desires STI screening?  Yes  Has patient been screened once for HCV in the past?  Yes See other encounter from today.   No results found for: "HCVAB"  Does the patient have current of drug use, have a partner with drug use, and/or has been incarcerated since last result? Yes  If yes-- Screen for HCV through [facility]   Does the patient meet criteria for HBV testing? Yes See other encounter from today.   Criteria:  -Household, sexual or needle sharing contact with HBV -History of drug use -HIV positive -Those with known Hep C   Health Maintenance Due  Topic Date Due   Cervical Cancer Screening (Pap smear)  12/03/2022   INFLUENZA VACCINE  Never done    COVID-19 Vaccine (1 - 2023-24 season) Never done    Review of Systems  Genitourinary:        Patient reports with 2 lesions to external vaginal area. Review of chart indicates hx of HSV 2 with treatment from 11/2021.  All other systems reviewed and are negative.   The following portions of the patient's history were reviewed and updated as appropriate: allergies, current medications, past family history, past medical history, past social history, past surgical history and problem list. Problem list updated.  Objective:   Vitals:   06/19/23 1527  BP: 112/79  Pulse: 71  Weight: 159 lb (72.1 kg)  Height: 5' 1.5" (1.562 m)    Physical Exam See documentation of PE from other encounter note from today.    Assessment and Plan:  Alejandra Coleman is a 42 y.o. female G3P2002 presenting to the [facility] Department for an yearly wellness and contraception visit  Contraception counseling: Reviewed options based on patient desire and reproductive life plan. Patient is interested in Female Condom. This was provided to the patient today.    Risks, benefits, and typical effectiveness rates were reviewed.  Questions were answered.  Written information was also given to the patient to review.    The patient will follow up in  1 years for surveillance.  The patient was told to call with any further questions, or with any concerns about contraception.  Emphasized use of condoms 100% of the time for STI prevention.  Educated on ECP and assessed need for ECP. Patient was NOT offered ECP based on > 120 hours .  Patient is within 7 days of unprotected sex.   1. Family planning  - IGP, Aptima HPV  Due to language barrier, a Spanish interpreter Alejandra Coleman) was present during the history-taking and subsequent discussion (and for part of the physical exam) with this patient.  Return in about 1 year (around 06/18/2024) for annual well woman exam.  No future  appointments.  Edmonia James, NP

## 2023-06-19 NOTE — Progress Notes (Signed)
.  att

## 2023-06-19 NOTE — Progress Notes (Signed)
Pt is here for std screening.  Wet prep results reviewed, no treatment per standing order. The patient was dispensed acyclovir x20 tablets today. I provided counseling today regarding the medication. We discussed the medication, the side effects and when to call clinic. Patient given the opportunity to ask questions. Questions answered.  Latex free condoms given. Gaspar Garbe, RN

## 2023-06-20 LAB — WET PREP FOR TRICH, YEAST, CLUE
Trichomonas Exam: NEGATIVE
Yeast Exam: NEGATIVE

## 2023-06-23 NOTE — Progress Notes (Signed)
Patient ID: Alejandra Coleman, female   DOB: 05-12-1981, 42 y.o.   MRN: [id number] 1. 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 Patient prescribed Acyclovir, which was provided in clinic that day. Will provide patient with Acyclovir today for current outbreak.   - acyclovir (ZOVIRAX) 400 MG tablet; Take 2 tablets (800 mg total) by mouth 2 (two) times daily for 5 days.  Dispense: 20 tablet; Refill: 0

## 2023-06-26 LAB — IGP, APTIMA HPV
HPV Aptima: NEGATIVE
PAP Smear Comment: 0

## 2023-06-26 NOTE — Progress Notes (Signed)
NILM, HPV (-). Please send patient a letter to repeat PAP in one year based on ASCCP guidelines due to positive biopsy history. Thank you.

## 2023-09-01 ENCOUNTER — Ambulatory Visit: Payer: Self-pay

## 2023-11-30 ENCOUNTER — Emergency Department: Payer: Self-pay

## 2023-11-30 ENCOUNTER — Emergency Department
Admission: EM | Admit: 2023-11-30 | Discharge: 2023-11-30 | Disposition: A | Discharge status: Home / Self Care | Payer: Self-pay | Attending: Emergency Medicine | Admitting: Emergency Medicine

## 2023-11-30 ENCOUNTER — Other Ambulatory Visit: Payer: Self-pay

## 2023-11-30 ENCOUNTER — Encounter: Payer: Self-pay | Admitting: Intensive Care

## 2023-11-30 DIAGNOSIS — O26891 Other specified pregnancy related conditions, first trimester: Secondary | ICD-10-CM | POA: Insufficient documentation

## 2023-11-30 DIAGNOSIS — R1032 Left lower quadrant pain: Secondary | ICD-10-CM | POA: Insufficient documentation

## 2023-11-30 DIAGNOSIS — Z3A01 Less than 8 weeks gestation of pregnancy: Secondary | ICD-10-CM | POA: Insufficient documentation

## 2023-11-30 DIAGNOSIS — R1031 Right lower quadrant pain: Secondary | ICD-10-CM | POA: Insufficient documentation

## 2023-11-30 DIAGNOSIS — O26899 Other specified pregnancy related conditions, unspecified trimester: Secondary | ICD-10-CM

## 2023-11-30 DIAGNOSIS — R102 Pelvic and perineal pain: Secondary | ICD-10-CM | POA: Insufficient documentation

## 2023-11-30 DIAGNOSIS — Z3491 Encounter for supervision of normal pregnancy, unspecified, first trimester: Secondary | ICD-10-CM

## 2023-11-30 LAB — HCG, QUANTITATIVE, PREGNANCY: hCG, Beta Chain, Quant, S: 5037 m[IU]/mL — ABNORMAL HIGH (ref <5)

## 2023-11-30 LAB — URINALYSIS, ROUTINE W REFLEX MICROSCOPIC
Bacteria, UA: NONE SEEN
Bilirubin Urine: NEGATIVE
Glucose, UA: NEGATIVE mg/dL
Ketones, ur: NEGATIVE mg/dL
Leukocytes,Ua: NEGATIVE
Nitrite: NEGATIVE
Protein, ur: NEGATIVE mg/dL
Specific Gravity, Urine: 1.011 (ref 1.005–1.030)
pH: 6 (ref 5.0–8.0)

## 2023-11-30 LAB — COMPREHENSIVE METABOLIC PANEL
ALT: 23 U/L (ref 0–44)
AST: 17 U/L (ref 15–41)
Albumin: 3.9 g/dL (ref 3.5–5.0)
Alkaline Phosphatase: 40 U/L (ref 38–126)
Anion gap: 8 (ref 5–15)
BUN: 12 mg/dL (ref 6–20)
CO2: 20 mmol/L — ABNORMAL LOW (ref 22–32)
Calcium: 8.6 mg/dL — ABNORMAL LOW (ref 8.9–10.3)
Chloride: 111 mmol/L (ref 98–111)
Creatinine, Ser: 0.6 mg/dL (ref 0.44–1.00)
GFR, Estimated: >60 mL/min (ref >60)
Glucose, Bld: 104 mg/dL — ABNORMAL HIGH (ref 70–99)
Potassium: 3.5 mmol/L (ref 3.5–5.1)
Sodium: 139 mmol/L (ref 135–145)
Total Bilirubin: 0.8 mg/dL (ref 0.0–1.2)
Total Protein: 7.1 g/dL (ref 6.5–8.1)

## 2023-11-30 LAB — CBC
HCT: 38.2 % (ref 36.0–46.0)
Hemoglobin: 13 g/dL (ref 12.0–15.0)
MCH: 31 pg (ref 26.0–34.0)
MCHC: 34 g/dL (ref 30.0–36.0)
MCV: 91.2 fL (ref 80.0–100.0)
Platelets: 219 10*3/uL (ref 150–400)
RBC: 4.19 MIL/uL (ref 3.87–5.11)
RDW: 12.1 % (ref 11.5–15.5)
WBC: 7.3 10*3/uL (ref 4.0–10.5)
nRBC: 0 % (ref 0.0–0.2)

## 2023-11-30 LAB — LIPASE, BLOOD: Lipase: 24 U/L (ref 11–51)

## 2023-11-30 LAB — PREGNANCY, URINE: Preg Test, Ur: POSITIVE — AB

## 2023-11-30 NOTE — Discharge Instructions (Signed)
 Take prenatal pills Follow up with the health department, you need a repeat beta hcg and ultrasound in 10 days Return if worsening

## 2023-11-30 NOTE — ED Notes (Signed)
 Patient transported to Ultrasound

## 2023-11-30 NOTE — ED Triage Notes (Signed)
  Patient reports she is [redacted] weeks pregnant and having pelvic pain and lower back pain that started Friday that has worsened. Denies any bleeding or discharge.

## 2023-11-30 NOTE — ED Provider Notes (Signed)
 [facility] Provider Note    Event Date/Time   First MD Initiated Contact with Patient 11/30/23 [phone]     (approximate)   History   Pelvic Pain   HPI  Alejandra Coleman [name] is a 43 y.o. female G4, P2 with significant history of ectopic pregnancy presents emergency department stating that she is about [redacted] weeks pregnant is having pelvic and lower back pain.  Symptoms started on Friday but have worsened over the last few days.  Denies bleeding or discharge at this time.  Had positive home pregnancy test but has not been seen by GYN at this time.      Physical Exam   Triage Vital Signs: ED Triage Vitals  Encounter Vitals Group     BP 11/30/23 0920 122/81     Systolic BP Percentile --      Diastolic BP Percentile --      Pulse Rate 11/30/23 0920 78     Resp 11/30/23 0920 16     Temp 11/30/23 0920 99 F (37.2 C)     Temp Source 11/30/23 0920 Oral     SpO2 11/30/23 0920 95 %     Weight 11/30/23 0922 158 lb 11.2 oz (72 kg)     Height 11/30/23 0947 5' 1.5" (1.562 m)     Head Circumference --      Peak Flow --      Pain Score 11/30/23 0922 6     Pain Loc --      Pain Education --      Exclude from Growth Chart --     Most recent vital signs: Vitals:   11/30/23 0920 11/30/23 1333  BP: 122/81 113/78  Pulse: 78 69  Resp: 16 17  Temp: 99 F (37.2 C) 98.7 F (37.1 C)  SpO2: 95% 100%     General: Awake, no distress.   CV:  Good peripheral perfusion. regular rate and  rhythm Resp:  Normal effort.  Abd:  No distention.  Abdomen tender in the right and left lower quadrant Other:      ED Results / Procedures / Treatments   Labs (all labs ordered are listed, but only abnormal results are displayed) Labs Reviewed  COMPREHENSIVE METABOLIC PANEL - Abnormal; Notable for the following components:      Result Value   CO2 20 (*)    Glucose, Bld 104 (*)    Calcium 8.6 (*)    All other components within normal limits  URINALYSIS, ROUTINE W  REFLEX MICROSCOPIC - Abnormal; Notable for the following components:   Color, Urine YELLOW (*)    APPearance HAZY (*)    Hgb urine dipstick SMALL (*)    All other components within normal limits  HCG, QUANTITATIVE, PREGNANCY - Abnormal; Notable for the following components:   hCG, Beta Chain, Quant, S 5,037 (*)    All other components within normal limits  PREGNANCY, URINE - Abnormal; Notable for the following components:   Preg Test, Ur POSITIVE (*)    All other components within normal limits  LIPASE, BLOOD  CBC     EKG     RADIOLOGY Ultrasound OB less than 14 weeks    PROCEDURES:   Procedures Chief Complaint  Patient presents with   Pelvic Pain      MEDICATIONS ORDERED IN ED: Medications - No data to display   IMPRESSION / MDM / ASSESSMENT AND PLAN / ED COURSE  I reviewed the triage vital signs and the  nursing notes.                              Differential diagnosis includes, but is not limited to, ectopic pregnancy, threatened miscarriage, miscarriage, round ligament pain  Patient's presentation is most consistent with acute illness / injury with system symptoms.   Urine pregnancy is positive, CBC metabolic panel and urinalysis along with lipase are reassuring  Beta-hCG 5037  Ultrasound OB less than 14 weeks   Ultrasound OB less than 14 weeks was independently reviewed interpreted by me does have gestational sac.  Radiologist comments tiny embryo with no cardiac activity although this may be due to early gestation  I did explain these findings to the patient.  She is to follow with [facility] department.  Have a repeat beta-hCG and ultrasound in 10 to 14 days.  Return emergency department worsening.  Patient is in agreement with treatment plan.  She was given a work note and discharged stable condition.   FINAL CLINICAL IMPRESSION(S) / ED DIAGNOSES   Final diagnoses:  Pelvic pain in pregnancy  First trimester pregnancy     Rx /  DC Orders   ED Discharge Orders     None        Note:  This document was prepared using Dragon voice recognition software and may include unintentional dictation errors.    Faythe Ghee, PA-C 11/30/23 1513    Minna Antis, MD 11/30/23 [phone]

## 2023-11-30 NOTE — ED Notes (Signed)
 Patient complains of abdominal cramping pain 7/10 since Friday. Pt states that the pain comes and goes, with no reported bleeding.

## 2024-01-02 ENCOUNTER — Emergency Department: Payer: Self-pay

## 2024-01-02 ENCOUNTER — Emergency Department
Admission: EM | Admit: 2024-01-02 | Discharge: 2024-01-02 | Disposition: A | Discharge status: Home / Self Care | Payer: Self-pay | Attending: Emergency Medicine | Admitting: Emergency Medicine

## 2024-01-02 ENCOUNTER — Other Ambulatory Visit: Payer: Self-pay

## 2024-01-02 DIAGNOSIS — O4690 Antepartum hemorrhage, unspecified, unspecified trimester: Secondary | ICD-10-CM

## 2024-01-02 DIAGNOSIS — O209 Hemorrhage in early pregnancy, unspecified: Secondary | ICD-10-CM | POA: Insufficient documentation

## 2024-01-02 DIAGNOSIS — Z3A01 Less than 8 weeks gestation of pregnancy: Secondary | ICD-10-CM | POA: Insufficient documentation

## 2024-01-02 LAB — CBC
HCT: 38 % (ref 36.0–46.0)
Hemoglobin: 13.2 g/dL (ref 12.0–15.0)
MCH: 31.8 pg (ref 26.0–34.0)
MCHC: 34.7 g/dL (ref 30.0–36.0)
MCV: 91.6 fL (ref 80.0–100.0)
Platelets: 243 10*3/uL (ref 150–400)
RBC: 4.15 MIL/uL (ref 3.87–5.11)
RDW: 11.9 % (ref 11.5–15.5)
WBC: 7.1 10*3/uL (ref 4.0–10.5)
nRBC: 0 % (ref 0.0–0.2)

## 2024-01-02 LAB — BASIC METABOLIC PANEL WITH GFR
Anion gap: 8 (ref 5–15)
BUN: 9 mg/dL (ref 6–20)
CO2: 23 mmol/L (ref 22–32)
Calcium: 8.6 mg/dL — ABNORMAL LOW (ref 8.9–10.3)
Chloride: 105 mmol/L (ref 98–111)
Creatinine, Ser: 0.58 mg/dL (ref 0.44–1.00)
GFR, Estimated: >60 mL/min (ref >60)
Glucose, Bld: 127 mg/dL — ABNORMAL HIGH (ref 70–99)
Potassium: 3.6 mmol/L (ref 3.5–5.1)
Sodium: 136 mmol/L (ref 135–145)

## 2024-01-02 LAB — POC URINE PREG, ED: Preg Test, Ur: POSITIVE — AB

## 2024-01-02 LAB — HCG, QUANTITATIVE, PREGNANCY: hCG, Beta Chain, Quant, S: 8018 m[IU]/mL — ABNORMAL HIGH (ref <5)

## 2024-01-02 LAB — ABO/RH: ABO/RH(D): A POS

## 2024-01-02 NOTE — ED Provider Notes (Signed)
 [facility] Provider Note    Event Date/Time   First MD Initiated Contact with Patient 01/02/24 1526     (approximate)   History   Vaginal Bleeding   HPI  Alejandra Coleman is a 43 year old female G4P2  presenting to the emergency department for evaluation of lower abdominal pain and vaginal bleeding.  LMP 10/26/2023.  Patient was seen in our ER on 11/30/2023 for similar pelvic cramping.  Reports that this is ongoing but today she had onset of small amount of blood tinge in her discharge to now a small amount of frank red blood.  Presents for evaluation in the setting of this.  Reviewed her ER visit from 11/02/2023.  At that time, her ultrasound demonstrated a gestational sac with questionable small embryo without cardiac activity with recommended follow-up ultrasound in 10 to 14 days.    Physical Exam   Triage Vital Signs: ED Triage Vitals [01/02/24 1450]  Encounter Vitals Group     BP (!) 138/98     Systolic BP Percentile      Diastolic BP Percentile      Pulse Rate 82     Resp 20     Temp 98.1 F (36.7 C)     Temp Source Oral     SpO2 100 %     Weight      Height      Head Circumference      Peak Flow      Pain Score 6     Pain Loc      Pain Education      Exclude from Growth Chart     Most recent vital signs: Vitals:   01/02/24 1527 01/02/24 1854  BP:  111/69  Pulse:  69  Resp:  18  Temp:  98.2 F (36.8 C)  SpO2: 100% 100%     General: Awake, interactive  CV:  Regular rate, good peripheral perfusion.  Resp:  Unlabored respirations Abd:  Nondistended, soft, mild tenderness over the suprapubic region, remainder of abdomen nontender Neuro:  Symmetric facial movement, fluid speech   ED Results / Procedures / Treatments   Labs (all labs ordered are listed, but only abnormal results are displayed) Labs Reviewed  HCG, QUANTITATIVE, PREGNANCY - Abnormal; Notable for the following components:      Result Value   hCG,  Beta Chain, Quant, S 8,018 (*)    All other components within normal limits  BASIC METABOLIC PANEL WITH GFR - Abnormal; Notable for the following components:   Glucose, Bld 127 (*)    Calcium 8.6 (*)    All other components within normal limits  POC URINE PREG, ED - Abnormal; Notable for the following components:   Preg Test, Ur Positive (*)    All other components within normal limits  CBC  ABO/RH     EKG EKG independently reviewed interpreted by myself (ER attending) demonstrates:    RADIOLOGY Imaging independently reviewed and interpreted by myself demonstrates:  Ultrasound demonstrates IUP without fetal cardiac activity  Formal Radiology Read:  US  OB Comp < 14 Wks Result Date: 01/02/2024 CLINICAL DATA:  Vaginal bleeding in early pregnancy. EXAM: OBSTETRIC <14 WK ULTRASOUND TECHNIQUE: Transabdominal ultrasound was performed for evaluation of the gestation as well as the maternal uterus and adnexal regions. COMPARISON:  11/30/2023. FINDINGS: Intrauterine gestational sac: Single Yolk sac:  Yes Embryo:  No Cardiac Activity: No Heart Rate: None MSD:  14.4 mm   6 w   2  d Subchorionic hemorrhage:  There is a small subchorionic hemorrhage. Maternal uterus/adnexae: The right ovary is not seen. The left ovary is not well visualized. Prominent vessels are noted around the periphery of the uterus. No free fluid in the pelvis. IMPRESSION: 1. Single intrauterine gestational sac with yolk sac and estimated gestational age of [redacted] weeks 2 days. A fetal pole and cardiac activity are not identified at this time. Findings may be related to early pregnancy versus failed early pregnancy. Correlation with beta-hCG and follow-up ultrasound are recommended. 2. Small subchorionic hemorrhage, slightly increased in size from the prior exam. Electronically Signed   By: Wyvonnia Heimlich M.D.   On: 01/02/2024 18:54   US  OB Transvaginal Result Date: 01/02/2024 CLINICAL DATA:  Vaginal bleeding in early pregnancy. EXAM:  OBSTETRIC <14 WK ULTRASOUND TECHNIQUE: Transabdominal ultrasound was performed for evaluation of the gestation as well as the maternal uterus and adnexal regions. COMPARISON:  11/30/2023. FINDINGS: Intrauterine gestational sac: Single Yolk sac:  Yes Embryo:  No Cardiac Activity: No Heart Rate: None MSD:  14.4 mm   6 w   2 d Subchorionic hemorrhage:  There is a small subchorionic hemorrhage. Maternal uterus/adnexae: The right ovary is not seen. The left ovary is not well visualized. Prominent vessels are noted around the periphery of the uterus. No free fluid in the pelvis. IMPRESSION: 1. Single intrauterine gestational sac with yolk sac and estimated gestational age of [redacted] weeks 2 days. A fetal pole and cardiac activity are not identified at this time. Findings may be related to early pregnancy versus failed early pregnancy. Correlation with beta-hCG and follow-up ultrasound are recommended. 2. Small subchorionic hemorrhage, slightly increased in size from the prior exam. Electronically Signed   By: Wyvonnia Heimlich M.D.   On: 01/02/2024 18:54    PROCEDURES:  Critical Care performed: No  Procedures   MEDICATIONS ORDERED IN ED: Medications - No data to display   IMPRESSION / MDM / ASSESSMENT AND PLAN / ED COURSE  I reviewed the triage vital signs and the nursing notes.  Differential diagnosis includes, but is not limited to, ectopic pregnancy, threatened miscarriage, subchorionic hemorrhage, implantation bleeding  Patient's presentation is most consistent with acute presentation with potential threat to life or bodily function.  43 year old female presenting with lower abdominal cramping and vaginal bleeding in the setting of early pregnancy.  Stable vitals on presentation.  Labs from triage overall reassuring including normal hemoglobin.  Ultrasound last month with likely early pregnancy but will obtain repeat ultrasound to further ensure no evidence of ectopic as well as evaluate in the setting of  vaginal bleeding.  A+ blood type, no indication for RhoGAM.  US  is without cardiac activity, based on patient's clinical history I am concerned about a failed pregnancy.  This was discussed with patient.  She is comfortable with discharge home and outpatient follow-up with OB/GYN.  Case reviewed with CNM Oxley who is agreeable with plan for discharge and outpatient follow-up.  Strict return precautions provided.  Patient discharged stable condition.     FINAL CLINICAL IMPRESSION(S) / ED DIAGNOSES   Final diagnoses:  Vaginal bleeding in pregnancy     Rx / DC Orders   ED Discharge Orders     None        Note:  This document was prepared using Dragon voice recognition software and may include unintentional dictation errors.   Claria Crofts, MD 01/02/24 Larita Pluck

## 2024-01-02 NOTE — ED Triage Notes (Signed)
 Pt to ED via POV from home. Pt reports approximately 9-[redacted]wks pregnant. Pt reports light pink bleeding that has turned to bright red that started today. Pt reports abd cramping. Pt is G4P3. Pt seen at [facility].

## 2024-01-02 NOTE — Discharge Instructions (Signed)
 You were seen in the ER today for evaluation of your vaginal bleeding.  As we discussed, your ultrasound did not show cardiac activity which is concerning that you may have a nonviable pregnancy.  Please follow-up with OB/GYN to further discuss your ultrasound and recommendations.  Return to the ER for new or worsening symptoms including worsening abdominal pain, heavy vaginal bleeding including saturating a pad every hour for 2 hours in a row, or any other new or concerning symptoms.

## 2024-01-03 DIAGNOSIS — O039 Complete or unspecified spontaneous abortion without complication: Secondary | ICD-10-CM

## 2024-01-03 HISTORY — DX: Complete or unspecified spontaneous abortion without complication: O03.9

## 2024-01-07 ENCOUNTER — Other Ambulatory Visit: Payer: Self-pay

## 2024-01-07 ENCOUNTER — Encounter: Payer: Self-pay | Admitting: Intensive Care

## 2024-01-07 ENCOUNTER — Emergency Department: Payer: Self-pay

## 2024-01-07 ENCOUNTER — Emergency Department
Admission: EM | Admit: 2024-01-07 | Discharge: 2024-01-07 | Disposition: A | Discharge status: Home / Self Care | Payer: Self-pay | Attending: Emergency Medicine | Admitting: Emergency Medicine

## 2024-01-07 DIAGNOSIS — O039 Complete or unspecified spontaneous abortion without complication: Secondary | ICD-10-CM | POA: Insufficient documentation

## 2024-01-07 LAB — HCG, QUANTITATIVE, PREGNANCY: hCG, Beta Chain, Quant, S: 2707 m[IU]/mL — ABNORMAL HIGH (ref <5)

## 2024-01-07 LAB — CBC WITH DIFFERENTIAL/PLATELET
Abs Immature Granulocytes: 0.01 10*3/uL (ref 0.00–0.07)
Basophils Absolute: 0 10*3/uL (ref 0.0–0.1)
Basophils Relative: 0 %
Eosinophils Absolute: 0.1 10*3/uL (ref 0.0–0.5)
Eosinophils Relative: 2 %
HCT: 38.2 % (ref 36.0–46.0)
Hemoglobin: 13 g/dL (ref 12.0–15.0)
Immature Granulocytes: 0 %
Lymphocytes Relative: 35 %
Lymphs Abs: 1.9 10*3/uL (ref 0.7–4.0)
MCH: 31.3 pg (ref 26.0–34.0)
MCHC: 34 g/dL (ref 30.0–36.0)
MCV: 91.8 fL (ref 80.0–100.0)
Monocytes Absolute: 0.2 10*3/uL (ref 0.1–1.0)
Monocytes Relative: 4 %
Neutro Abs: 3.2 10*3/uL (ref 1.7–7.7)
Neutrophils Relative %: 59 %
Platelets: 215 10*3/uL (ref 150–400)
RBC: 4.16 MIL/uL (ref 3.87–5.11)
RDW: 11.8 % (ref 11.5–15.5)
WBC: 5.5 10*3/uL (ref 4.0–10.5)
nRBC: 0 % (ref 0.0–0.2)

## 2024-01-07 LAB — BASIC METABOLIC PANEL WITH GFR
Anion gap: 7 (ref 5–15)
BUN: 10 mg/dL (ref 6–20)
CO2: 22 mmol/L (ref 22–32)
Calcium: 8.5 mg/dL — ABNORMAL LOW (ref 8.9–10.3)
Chloride: 107 mmol/L (ref 98–111)
Creatinine, Ser: 0.58 mg/dL (ref 0.44–1.00)
GFR, Estimated: >60 mL/min (ref >60)
Glucose, Bld: 106 mg/dL — ABNORMAL HIGH (ref 70–99)
Potassium: 3.8 mmol/L (ref 3.5–5.1)
Sodium: 136 mmol/L (ref 135–145)

## 2024-01-07 MED ORDER — IBUPROFEN 800 MG PO TABS: 800.0000 mg | ORAL_TABLET | Freq: Three times a day (TID) | ORAL | 0 refills | Status: DC | PRN

## 2024-01-07 MED ORDER — MISOPROSTOL 200 MCG PO TABS: 800.0000 ug | ORAL_TABLET | Freq: Once | ORAL | 0 refills | Status: DC

## 2024-01-07 MED ORDER — ONDANSETRON 4 MG PO TBDP: 4.0000 mg | ORAL_TABLET | Freq: Three times a day (TID) | ORAL | 0 refills | Status: DC | PRN

## 2024-01-07 MED ORDER — OXYCODONE-ACETAMINOPHEN 5-325 MG PO TABS: 1.0000 | ORAL_TABLET | ORAL | 0 refills | Status: DC | PRN

## 2024-01-07 NOTE — ED Provider Notes (Signed)
 [facility] Provider Note    Event Date/Time   First MD Initiated Contact with Patient 01/07/24 1056     (approximate)   History   Vaginal Bleeding   HPI  Alejandra Coleman is a 43 y.o. female, G4, P3 who presents for evaluation of vaginal bleeding in the context of pregnancy.  LMP is 10/26/2023.  Patient has been evaluated multiple times for bleeding with this pregnancy.  On 3/17 she started having cramping/contractions and was seen in the emergency department,  had an ultrasound that did not show any cardiac activity and beta was 5,037.  She was seen by the emergency department again on 4/19 for vaginal bleeding, ultrasound did not show any cardiac activity and beta was 8,018.  She was seen by her OB/GYN yesterday for brown bleeding like spotting.  They did not get a beta hCG at this visit.  Today patient reports brown discharge with tissue, cramping, fatigue and a headache.    Physical Exam   Triage Vital Signs: ED Triage Vitals  Encounter Vitals Group     BP 01/07/24 1034 110/73     Systolic BP Percentile --      Diastolic BP Percentile --      Pulse Rate 01/07/24 1034 71     Resp 01/07/24 1034 16     Temp 01/07/24 1034 98.2 F (36.8 C)     Temp Source 01/07/24 1034 Oral     SpO2 01/07/24 1034 98 %     Weight 01/07/24 1040 154 lb (69.9 kg)     Height 01/07/24 1040 5\' 1"  (1.549 m)     Head Circumference --      Peak Flow --      Pain Score 01/07/24 1039 8     Pain Loc --      Pain Education --      Exclude from Growth Chart --     Most recent vital signs: Vitals:   01/07/24 1034  BP: 110/73  Pulse: 71  Resp: 16  Temp: 98.2 F (36.8 C)  SpO2: 98%   General: Awake, no distress.  CV:  Good peripheral perfusion.  Resp:  Normal effort.  Abd:  No distention.  Other:     ED Results / Procedures / Treatments   Labs (all labs ordered are listed, but only abnormal results are displayed) Labs Reviewed  HCG, QUANTITATIVE,  PREGNANCY - Abnormal; Notable for the following components:      Result Value   hCG, Beta Chain, Quant, S 2,707 (*)    All other components within normal limits  BASIC METABOLIC PANEL WITH GFR - Abnormal; Notable for the following components:   Glucose, Bld 106 (*)    Calcium 8.5 (*)    All other components within normal limits  CBC WITH DIFFERENTIAL/PLATELET  ABO/RH     RADIOLOGY  Transvaginal OB ultrasound obtained, interpreted the images as well as reviewed the radiologist report.  Images show probable early intrauterine gestational sac with yolk sac and fetal pole, but no cardiac activity yet visualized.  Crown-rump length measures 6 weeks 1 day.  PROCEDURES:  Critical Care performed: No  Procedures   MEDICATIONS ORDERED IN ED: Medications - No data to display   IMPRESSION / MDM / ASSESSMENT AND PLAN / ED COURSE  I reviewed the triage vital signs and the nursing notes.  43 year old female presents for evaluation of vaginal bleeding while pregnant.  Vital signs are stable patient NAD on exam.  Differential diagnosis includes, but is not limited to, subchorionic hemorrhage, threatened miscarriage, implantation bleeding, ectopic pregnancy.  Patient's presentation is most consistent with acute presentation with potential threat to life or bodily function.  CBC and BMP are unremarkable.  Patient is a positive, so RhoGAM is not indicated at this time.  Beta-hCG is 2707.  This is dropped from her previous level of 8018.    Ultrasound shows probable early intrauterine gestational sac with yolk sac and fetal pole but no cardiac activity yet visualized.  Crown-rump length is 6 weeks 1 day.  Patient should be 10 weeks and 2 days based on LMP.  At this point I would expect to see cardiac activity.  Given the drop in the beta-hCG and lack of cardiac activity, patient is having a miscarriage. I did confirm this with patient's Alejandra Coleman. She  recommended starting misoprostol  and follow-up in the office in 1 week for repeat ultrasound.  Patient may need D&C at that point if products of conception remain.  I explained to the patient how to take the medication and what to expect.  She voiced understanding, all questions were answered and she is stable at discharge.    FINAL CLINICAL IMPRESSION(S) / ED DIAGNOSES   Final diagnoses:  Miscarriage     Rx / DC Orders   ED Discharge Orders          Ordered    misoprostol  (CYTOTEC ) 200 MCG tablet   Once        01/07/24 1340    ondansetron  (ZOFRAN -ODT) 4 MG disintegrating tablet  Every 8 hours PRN        01/07/24 1340    oxyCODONE -acetaminophen  (PERCOCET) 5-325 MG tablet  Every 4 hours PRN        01/07/24 1341    ibuprofen  (ADVIL ) 800 MG tablet  Every 8 hours PRN        01/07/24 1342             Note:  This document was prepared using Dragon voice recognition software and may include unintentional dictation errors.   Phyliss Breen, PA-C 01/07/24 1404    Twilla Galea, MD 01/07/24 [phone]

## 2024-01-07 NOTE — ED Triage Notes (Addendum)
 Based on US  01/02/24, patient is [redacted] weeks pregnant. Patient presents with brown discharge/tissue  from vagina. Patient was seen for same on 01/02/24 and seen yesterday at [facility] clinic. Reports they scheduled upcoming US  but came in today because of this new discharge. Also c/o headache and chills.   Last menstrual cycle started February 10th.

## 2024-01-07 NOTE — Discharge Instructions (Addendum)
 Medication instructions:  You will place the all 4 pills of the medicine (misoprostol ) into your vagina. Also, please take the ibuprofen  around the clock (every 6 hours) for the first 24 hours, and then as needed after that. You can take the Percocet as needed.  You can repeat another dose, no earlier than 3 hours after the first dose and typically within 7 days if there is no response to the first dose.  My recommendation is 24 hours after the first dose if there is not a response to the first dose.   What to expect:  After you place the medicine in your vagina, you will start to have heavier bleeding and cramping in a couple of hours. The bleeding typically starts as spotting or light bleeding (less than the volume of a period) and then reaches its heaviest flow for two to four hours while your uterus squeezes out the tissue inside. This coincides with the pattern of pain, which is typically absent or dull and crampy and then increases to intense pain. The pain then typically gets better, and a dull ache or crampy feeling may remain off and on for up to two weeks. I will call in a prescription for ibuprofen  and Percocet to help with the cramping. Do not drive after taking the percocet as this medication is sedating. Some patients may experience nausea or vomiting, I have sent a medication called Zofran  to help with this. Zofran  can be take every 8 hours. Please call the OBGYN if there is bleeding that completely soaks two sanitary pads per hour for more than two hours or for pain that does not get better after you pass the tissue and clots, or that is located outside the pelvis.  Please call the OBGYN for a follow up appointment in a week to make sure that everything is ok. Please call with any questions before then.

## 2024-01-14 ENCOUNTER — Encounter: Payer: Self-pay | Admitting: Family Medicine

## 2024-01-14 ENCOUNTER — Other Ambulatory Visit: Payer: Self-pay | Admitting: Family Medicine

## 2024-01-27 ENCOUNTER — Ambulatory Visit: Payer: Self-pay | Admitting: Family Medicine

## 2024-01-27 ENCOUNTER — Encounter: Payer: Self-pay | Admitting: Family Medicine

## 2024-01-27 ENCOUNTER — Ambulatory Visit: Payer: Self-pay

## 2024-01-27 DIAGNOSIS — Z113 Encounter for screening for infections with a predominantly sexual mode of transmission: Secondary | ICD-10-CM

## 2024-01-27 DIAGNOSIS — Z8619 Personal history of other infectious and parasitic diseases: Secondary | ICD-10-CM

## 2024-01-27 LAB — WET PREP FOR TRICH, YEAST, CLUE
Clue Cell Exam: NEGATIVE
Trichomonas Exam: NEGATIVE
Yeast Exam: NEGATIVE

## 2024-01-27 LAB — HM HIV SCREENING LAB: HM HIV Screening: NEGATIVE

## 2024-01-27 MED ORDER — ACYCLOVIR 400 MG PO TABS: 800.0000 mg | ORAL_TABLET | Freq: Two times a day (BID) | ORAL | 0 refills | Status: DC

## 2024-01-27 NOTE — Progress Notes (Signed)
 The [facility] Department STI clinic [address], [address] [place] [address] Main phone: [phone]  STI screening visit  Subjective:  Alejandra Coleman is a 43 y.o. female being seen today for an STI screening visit. The patient reports they do have symptoms.  Patient reports that they do not desire a pregnancy in the next year.   They reported they are not interested in discussing contraception today.    Patient's last menstrual period was 10/26/2023 (exact date).  Patient has the following medical conditions:  Patient Active Problem List   Diagnosis Date Noted   Vapes nicotine containing substance 05/03/2020   Overweight 05/03/2020   Chief Complaint  Patient presents with   SEXUALLY TRANSMITTED DISEASE    HPI Patient reports to clinic with c/o burning with urination and itching- states she feels like she might be getting a herpes outbreak  Does the patient using douching products? No  See flowsheet for further details and programmatic requirements Hyperlink available at the top of the signed note in blue.  Flow sheet content below:  Pregnancy Intention Screening Does the patient want to become pregnant in the next year?: No Does the patient's partner want to become pregnant in the next year?: No Would the patient like to discuss contraceptive options today?: No Reason For STD Screen STD Screening: Has symptoms Have you ever had an STD?: Yes History of Antibiotic use in the past 2 weeks?: No STD Symptoms Genital Itching: Yes Dysuria: Yes Risk Factors for Hep B Household, sexual, or needle sharing contact of a person infected with Hep B: No Sexual contact with a person who uses drugs not as prescribed?: No Currently or Ever used drugs not as prescribed: No HIV Positive: No PRep Patient: No Men who have sex with men: N/A Have Hepatitis C: No History of Incarceration: No History of Homeslessness?: No Anal sex following anal  drug use?: No Risk Factors for Hep C Currently using drugs not as prescribed: No Sexual partner(s) currently using drugs as not prescribed: No HIV Positive: No People with a history of incarceration: No People born between the years of 33 and 35: No Abuse History Has patient ever been abused physically?: No Has patient ever been abused sexually?: No Does patient feel they have a problem with Anxiety?: Yes Does patient feel they have a problem with Depression?: Yes Referral to Behavioral Health: Declined Counseling Patient counseled to use condoms with all sex: Condoms given RTC in 2-3 weeks for test results: Yes Clinic will call if test results abnormal before test result appt.: Yes Test results given to patient Patient counseled to use condoms with all sex: Condoms given   Screening for MPX risk: Does the patient have an unexplained rash? No Is the patient MSM? No Does the patient endorse multiple sex partners or anonymous sex partners? No Did the patient have close or sexual contact with a person diagnosed with MPX? No Has the patient traveled outside the [place]  where MPX is endemic? No Is there a high clinical suspicion for MPX-- evidenced by one of the following No  -Unlikely to be chickenpox  -Lymphadenopathy  -Rash that present in same phase of evolution on any given body part  Screenings: Last HIV test per patient/review of record was  Lab Results  Component Value Date   HMHIVSCREEN Negative - Validated 06/19/2023   No results found for: "HIV"   Last HEPC test per patient/review of record was  Lab Results  Component Value Date  HMHEPCSCREEN Negative-Validated 06/19/2023   No components found for: "HEPC"   Last HEPB test per patient/review of record was No components found for: "HMHEPBSCREEN"   Patient reports last pap was:   Lab Results  Component Value Date   SPECADGYN Comment 06/19/2023   Result Date Procedure Results Follow-ups  06/19/2023 IGP, Aptima HPV  DIAGNOSIS:: Comment Specimen adequacy:: Comment Clinician Provided ICD10: Comment Performed by:: Comment QC reviewed by:: Comment PAP Smear Comment: . Note:: Comment Test Methodology: Comment HPV Aptima: Negative   03/25/2022 Surgical pathology SURGICAL PATHOLOGY: SURGICAL PATHOLOGY CASE: [id number] PATIENT: Alejandra Coleman Surgical Pathology Report     Clinical History: NIL HPV pos x 2022 and 2023 (ms)     FINAL MICROSCOPIC DIAGNOSIS:  A.   CERVIX, 6 AND 12 O'CLOCK, BIOPSY: Low-grade...   12/02/2021 IGP, Aptima HPV DIAGNOSIS:: Comment Specimen adequacy:: Comment Clinician Provided ICD10: Comment Performed by:: Comment PAP Smear Comment: . Note:: Comment Test Methodology: Comment HPV Aptima: Positive (A)   01/31/2021 IGP, Aptima HPV DIAGNOSIS:: Comment Specimen adequacy:: Comment Clinician Provided ICD10: Comment Performed by:: Comment PAP Smear Comment: . Note:: Comment Test Methodology: Comment HPV Aptima: Positive (A)   01/30/2015 HM PAP SMEAR HM Pap smear: Negative, HPV negative     Immunization history:  Immunization History  Administered Date(s) Administered   Tdap 09/02/2020    The following portions of the patient's history were reviewed and updated as appropriate: allergies, current medications, past medical history, past social history, past surgical history and problem list.  Objective:  There were no vitals filed for this visit. Physical Exam Vitals and nursing note reviewed. Exam conducted with a chaperone present Susanna Epley CNA).  Constitutional:      Appearance: Normal appearance.  HENT:     Head: Normocephalic and atraumatic.     Mouth/Throat:     Mouth: Mucous membranes are moist.     Pharynx: Oropharynx is clear. No oropharyngeal exudate or posterior oropharyngeal erythema.  Pulmonary:     Effort: Pulmonary effort is normal.  Abdominal:     General: Abdomen is flat.     Palpations: There is no mass.     Tenderness: There  is no abdominal tenderness. There is no rebound.  Genitourinary:    General: Normal vulva.     Exam position: Lithotomy position.     Pubic Area: No rash or pubic lice.      Labia:        Right: No rash or lesion.        Left: No rash or lesion.      Vagina: Vaginal discharge present. No erythema, bleeding or lesions.     Cervix: No cervical motion tenderness, discharge, friability, lesion or erythema.     Uterus: Normal.      Adnexa: Right adnexa normal and left adnexa normal.     Rectum: Normal.     Comments: pH = 4  Small amt of yellow vaginal discharge present  Lymphadenopathy:     Head:     Right side of head: No preauricular or posterior auricular adenopathy.     Left side of head: No preauricular or posterior auricular adenopathy.     Cervical: No cervical adenopathy.     Upper Body:     Right upper body: No supraclavicular, axillary or epitrochlear adenopathy.     Left upper body: No supraclavicular, axillary or epitrochlear adenopathy.     Lower Body: No right inguinal adenopathy. No left inguinal adenopathy.  Skin:  General: Skin is warm and dry.     Findings: No rash.  Neurological:     Mental Status: She is alert and oriented to person, place, and time.     Assessment and Plan:  Alejandra Coleman is a 43 y.o. female presenting to the [facility] Department for STI screening  1. Screening for venereal disease (Primary) -counseled to see PCP or UC for urinary burning without improvement in after acyclovir   - Chlamydia/Gonorrhea [place] [facility] - HIV [place] [facility] - Syphilis Serology, [place] [facility] - WET PREP FOR TRICH, YEAST, CLUE  2. History of herpes genitalis -no evidence of current outbreak -however pt reports feeling itching and burning also with depression- which she states occurs prior to outbreak -will give medication today empirically    Patient accepted the following screenings: vaginal CT/GC swabs, vaginal wet prep, HIV, and  RPR Patient meets criteria for HepB screening? No. Ordered? not applicable Patient meets criteria for HepC screening? No. Ordered? not applicable  Treat wet prep per standing order Discussed time line for State Lab results and that patient will be called with positive results and encouraged patient to call if she had not heard in 2 weeks.  Counseled to return or seek care for continued or worsening symptoms Recommended repeat testing in 3 months with positive results. Recommended condom use with all sex for STI prevention.   Patient is currently using Hormonal Contraception: Injection, Rings and Patches to prevent pregnancy.    Return if symptoms worsen or fail to improve, for STI screening.  Future Appointments  Date Time Provider Department Center  01/29/2024  9:05 AM [facility] PROVIDER [facility] None    Earleen Glazier, FNP

## 2024-01-27 NOTE — Progress Notes (Addendum)
 Pt is here for STD screening. Wet prep reviewed with pt and requires no treatment. Condoms given. Austine Lefort, RN.

## 2024-01-29 ENCOUNTER — Ambulatory Visit: Payer: Self-pay

## 2024-02-10 ENCOUNTER — Ambulatory Visit (LOCAL_COMMUNITY_HEALTH_CENTER): Payer: Self-pay

## 2024-02-10 VITALS — BP 135/61 | Ht 61.0 in | Wt 163.5 lb

## 2024-02-10 DIAGNOSIS — Z3009 Encounter for other general counseling and advice on contraception: Secondary | ICD-10-CM

## 2024-02-10 DIAGNOSIS — Z30013 Encounter for initial prescription of injectable contraceptive: Secondary | ICD-10-CM

## 2024-02-10 DIAGNOSIS — Z3202 Encounter for pregnancy test, result negative: Secondary | ICD-10-CM

## 2024-02-10 LAB — PREGNANCY, URINE: Preg Test, Ur: NEGATIVE

## 2024-02-10 MED ORDER — MEDROXYPROGESTERONE ACETATE 150 MG/ML IM SUSP
150.0000 mg | Freq: Once | INTRAMUSCULAR | Status: AC
  Administered 2024-02-10: 150 mg via INTRAMUSCULAR

## 2024-02-10 NOTE — Progress Notes (Signed)
 In nurse clinic. UPT negative. Negative pregnancy packet given and reviewed.   Patient explains she had positve home UPT 3 days ago. Recent pregnancy and miscarriage 01/03/2024. Denies bleeding today and no abdominal pain. Seen in ER 01/07/2024 and [facility] STI clinic on 01/27/2024.   Wants to start depo and have ECP today. Patient states she has had depo in the past and no problems.   Last sex (unprotected) yesterday. And sex before that on 01/07/24 (approx). Confirms headache today and rates it as "severe" both sides of temple. Also, notes dizziness and decreased appetite.   Consult Fain Home, FNP who orders Beta HCG level today and patient may start depo today. No ECP today. Provider explains unlikely that patient is pregnant and depo would be ok to give today. Recommends tylenol  /ibuprofen  for headache. Advises patient to schedule PE in [place] [facility].   Depo given today per verbal order Fain Home, FNP. Tolerated well R delt. Next depo due 04/27/2024. Reminder given. Reminded patient to use back up method for 7 days. Condoms given. Depo consent signed.   RN walked patient to lab for Beta HCG and then patient plans to schedule PE with clerks.  RN explained that [facility] will contact her with Beta HCG results.  Melven Stable [place] interpreter for visit today. Yohanna Tow, RN

## 2024-02-11 ENCOUNTER — Ambulatory Visit: Payer: Self-pay

## 2024-02-11 LAB — BETA HCG QUANT (REF LAB): hCG Quant: 26 m[IU]/mL

## 2024-02-12 NOTE — Progress Notes (Signed)
 Pt has FP apt on Monday 6/2. Per HM, no need to call and schedule for nurse clinic.  Will address at apt.

## 2024-02-15 ENCOUNTER — Ambulatory Visit: Payer: Self-pay

## 2024-07-13 ENCOUNTER — Ambulatory Visit: Payer: Self-pay

## 2024-07-13 VITALS — BP 124/83 | HR 70 | Ht 61.0 in | Wt 161.8 lb

## 2024-07-13 DIAGNOSIS — Z3202 Encounter for pregnancy test, result negative: Secondary | ICD-10-CM

## 2024-07-13 DIAGNOSIS — Z3042 Encounter for surveillance of injectable contraceptive: Secondary | ICD-10-CM

## 2024-07-13 DIAGNOSIS — Z30013 Encounter for initial prescription of injectable contraceptive: Secondary | ICD-10-CM

## 2024-07-13 DIAGNOSIS — Z124 Encounter for screening for malignant neoplasm of cervix: Secondary | ICD-10-CM

## 2024-07-13 DIAGNOSIS — Z3009 Encounter for other general counseling and advice on contraception: Secondary | ICD-10-CM

## 2024-07-13 DIAGNOSIS — Z309 Encounter for contraceptive management, unspecified: Secondary | ICD-10-CM

## 2024-07-13 LAB — PREGNANCY, URINE: Preg Test, Ur: NEGATIVE

## 2024-07-13 MED ORDER — MEDROXYPROGESTERONE ACETATE 150 MG/ML IM SUSP
150.0000 mg | INTRAMUSCULAR | Status: AC
  Administered 2024-07-13: 150 mg via INTRAMUSCULAR

## 2024-07-13 NOTE — Progress Notes (Signed)
 Pt is here for PE and Depo. Depo IM Injection given to pt at the Rt deltoid and pt tolerated well to it. Reminder card given and condoms declined. Kwadwo Orhan Mayorga,RN.

## 2024-07-13 NOTE — Progress Notes (Signed)
 [facility] [address] [place] [address] Main phone: [phone]  Family Planning Visit - Repeat Yearly Visit  Subjective:  Alejandra Coleman is a 43 y.o. [id number]  being seen today for an annual wellness visit and to discuss contraception options. The patient is currently using female condom for pregnancy prevention, last Depo shot was 02/10/24 or [redacted]w[redacted]d ago. Patient does not want a pregnancy in the next year.   Patient has the following medical problems:  Patient Active Problem List   Diagnosis Date Noted   Vapes nicotine containing substance 05/03/2020   Overweight 05/03/2020   Chief Complaint  Patient presents with   Annual Exam    PE/Depo   HPI Patient reports desire for Depo Provera . Last shot was 02/10/24, 24 weeks 6 days.  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 Repeat today. Mammogram done in 2023 w/ [facility].  Desires HPV vaccine, but cost prohibited. Provided her with [facility] financial assistance application, discussed seeing if more affordable at primary care such as [facility].  Patient denies breast or vaginal concerns.  Review of Systems  All other systems reviewed and are negative.  See flowsheet for further details and programmatic requirements Hyperlink available at the top of the signed note in blue.  Flow sheet content below:  Pregnancy Intention Screening Does the patient want to become pregnant in the next year?: No Does the patient's partner want to become pregnant in the next year?: No Would the patient like to discuss contraceptive options today?: No Sexual History What age did you start your period?: 17 How often do you have your period?: no periods Date of last sex?: 07/12/24 Has the patient had unprotected sex within the last 5 days?: No Do you have sex with men, women, both men and women?: Men only In the past 2 months how many partners  have you had sex with?: 1 In the past 12 months, how many partners have you had sex with?: 1 Is it possible that any of your sex partners in the past 12 months had sex with someone else whild they were still in a sexual relationship with you?: No What ways do you have sex?: Vaginal Do you or your partner use condoms and/or dental dams every time you have vaginal, oral or anal sex?: Sometimes Do you douche?: No Date of last HIV test?: 01/27/24 Have you ever had an STD?: Yes Have any of your partners had an STD?: Yes Partner Previous STD?: Chlamydia Date?:  (2005) Have you or your partner ever shot up drugs?: No Have any of your partners used drugs in the past?: No Have you or your partners exchanged money or drugs for sex?: No Contraception Wrap Up Current Method: (P) Female Condom End Method: (P) Hormonal Injection Contraception Counseling Provided: (P) Yes How was the end contraceptive method provided?: (P) Provided on site Interpreter Interpreters used: (P) Alejandra Coleman  Diabetes screening This patient is 43 y.o. with a BMI of Body mass index is 30.57 kg/m.Alejandra Coleman  Is patient eligible for diabetes screening (age >35 and BMI >25)?  yes  Was Hgb A1c ordered? No/patient declines  STI screening Patient reports 1 of partners in last year.  Does this patient desire STI screening?  No - declines  Cervical Cancer Screening  Result Date Procedure Results Follow-ups  06/19/2023 IGP, Aptima HPV DIAGNOSIS:: Comment Specimen adequacy:: Comment Clinician Provided ICD10: Comment Performed by:: Comment QC  reviewed by:: Comment PAP Smear Comment: . Note:: Comment Test Methodology: Comment HPV Aptima: Negative   03/25/2022 Surgical pathology SURGICAL PATHOLOGY: SURGICAL PATHOLOGY CASE: [id number] PATIENT: Alejandra Coleman Surgical Pathology Report     Clinical History: NIL HPV pos x 2022 and 2023 (ms)     FINAL MICROSCOPIC DIAGNOSIS:  A.   CERVIX, 6 AND 12 O'CLOCK,  BIOPSY: Low-grade...   12/02/2021 IGP, Aptima HPV DIAGNOSIS:: Comment Specimen adequacy:: Comment Clinician Provided ICD10: Comment Performed by:: Comment PAP Smear Comment: . Note:: Comment Test Methodology: Comment HPV Aptima: Positive (A)   01/31/2021 IGP, Aptima HPV DIAGNOSIS:: Comment Specimen adequacy:: Comment Clinician Provided ICD10: Comment Performed by:: Comment PAP Smear Comment: . Note:: Comment Test Methodology: Comment HPV Aptima: Positive (A)   01/30/2015 HM PAP SMEAR HM Pap smear: Negative, HPV negative    Health Maintenance Due  Topic Date Due   Hepatitis B Vaccines 19-59 Average Risk (1 of 3 - 19+ 3-dose series) Never done   HPV VACCINES (1 - 3-dose SCDM series) Never done   Mammogram  Never done   Influenza Vaccine  Never done   COVID-19 Vaccine (1 - 2025-26 season) Never done   Cervical Cancer Screening (Pap smear)  06/18/2024   The following portions of the patient's history were reviewed and updated as appropriate: allergies, current medications, past family history, past medical history, past social history, past surgical history and problem list. Problem list updated.  Objective:   Vitals:   07/13/24 1315  BP: 124/83  Pulse: 70  Weight: 161 lb 12.8 oz (73.4 kg)  Height: 5' 1 (1.549 m)   Physical Exam Vitals and nursing note reviewed. Exam conducted with a chaperone present Kemp T).  Constitutional:      Appearance: Normal appearance.  HENT:     Head: Normocephalic and atraumatic.     Mouth/Throat:     Mouth: Mucous membranes are moist.     Pharynx: Oropharynx is clear. No oropharyngeal exudate or posterior oropharyngeal erythema.  Pulmonary:     Effort: Pulmonary effort is normal.  Chest:  Breasts:    Tanner Score is 5.     Right: Normal.     Left: Normal.  Abdominal:     General: Abdomen is flat.     Palpations: There is no mass.     Tenderness: There is no abdominal tenderness. There is no rebound.  Genitourinary:    General:  Normal vulva.     Exam position: Lithotomy position.     Pubic Area: No rash or pubic lice.      Labia:        Right: No rash or lesion.        Left: No rash or lesion.      Vagina: Normal. No vaginal discharge, erythema, bleeding or lesions.     Cervix: No cervical motion tenderness, discharge, friability, lesion or erythema.  Lymphadenopathy:     Head:     Right side of head: No preauricular or posterior auricular adenopathy.     Left side of head: No preauricular or posterior auricular adenopathy.     Cervical: No cervical adenopathy.     Upper Body:     Right upper body: No supraclavicular, axillary or epitrochlear adenopathy.     Left upper body: No supraclavicular, axillary or epitrochlear adenopathy.     Lower Body: No right inguinal adenopathy. No left inguinal adenopathy.  Skin:    General: Skin is warm and dry.     Findings:  No rash.  Neurological:     Mental Status: She is alert and oriented to person, place, and time.    Assessment and Plan:  Alejandra Coleman is a 43 y.o. female [phone] presenting to the [facility] Department for an yearly wellness and contraception visit  Family planning  Contraception counseling:  Reviewed options based on patient desire and reproductive life plan. Patient is interested in Hormonal Injection. This was provided to the patient today.   Risks, benefits, and typical effectiveness rates were reviewed.  Questions were answered.  Written information was also given to the patient to review.    The patient will follow up in  3 months for surveillance.  The patient was told to call with any further questions, or with any concerns about this method of contraception.  Emphasized use of condoms 100% of the time for STI prevention.  Emergency Contraception Precautions (ECP): Patient assessed for need of ECP. She is not a candidate based on barrier method used 100% correctly during intercourse per patient's confident report.    2. Encounter for management and injection of depo-Provera  (Primary)  - Pregnancy, urine - medroxyPROGESTERone  (DEPO-PROVERA ) injection 150 mg  3. Screening for cervical cancer  - IGP, Aptima HPV   Due to language barrier, a Spanish interpreter Kemp T.) was present in person during the history-taking, subsequent discussion, and physical exam with this patient.    Return in about 3 months (around 10/13/2024).  No future appointments.  Damien FORBES Satchel, NP

## 2024-07-16 LAB — IGP, APTIMA HPV
HPV Aptima: POSITIVE — AB
PAP Smear Comment: 0

## 2024-07-20 ENCOUNTER — Ambulatory Visit: Payer: Self-pay

## 2024-07-20 NOTE — Progress Notes (Signed)
 Pap card mailed and added pt to pap reminder list. Larraine JONELLE Novak, RN

## 2024-07-20 NOTE — Progress Notes (Signed)
 Cytology negative for intraepithelial lesion or malignancy, HPV positive. Colposcopy in 2023 following positive HPV in 2022 and 2023 - CIN1. Per current ASCCP guidelines, repeat pap test in 1 year.  Damien Satchel [facility]

## 2024-08-22 ENCOUNTER — Ambulatory Visit: Payer: Self-pay

## 2024-08-22 DIAGNOSIS — Z113 Encounter for screening for infections with a predominantly sexual mode of transmission: Secondary | ICD-10-CM

## 2024-08-22 DIAGNOSIS — B009 Herpesviral infection, unspecified: Secondary | ICD-10-CM | POA: Insufficient documentation

## 2024-08-22 LAB — WET PREP FOR TRICH, YEAST, CLUE
Clue Cell Exam: NEGATIVE
Trichomonas Exam: NEGATIVE
Yeast Exam: NEGATIVE

## 2024-08-22 LAB — HM HIV SCREENING LAB: HM HIV Screening: NEGATIVE

## 2024-08-22 MED ORDER — ACYCLOVIR 400 MG PO TABS: 400.0000 mg | ORAL_TABLET | Freq: Three times a day (TID) | ORAL | Status: AC

## 2024-08-22 NOTE — Progress Notes (Signed)
 [facility] Department STI clinic [address], [address] [place] [address] Main phone: [phone]  STI screening visit  Subjective:  Alejandra Coleman is a 43 y.o. female being seen today for an STI screening visit. The patient reports they do have symptoms.    Patient has the following medical conditions:  Patient Active Problem List   Diagnosis Date Noted   Vapes nicotine containing substance 05/03/2020   Overweight 05/03/2020   Chief Complaint  Patient presents with   SEXUALLY TRANSMITTED DISEASE    HPI Patient reports perineal itching and pimple that started last Monday. She denies thick white discharge. She has a history of HSV and usually takes episodic medication, but has run out. She states that her episodes are infrequent, but usually triggered by stress. She declines a counseling referral today.  Reproductive considerations Patient reports they are not pregnant . They do not desire a pregnancy in the next year. Patient is currently using hormonal injection to prevent pregnancy. They reported they are not interested in discussing contraception today.    No LMP recorded. Patient has had an injection.  Does the patient using douching products? No  Patient's routine cervical screening is up to date and next due next year.  See flowsheet for further details and programmatic requirements Hyperlink available at the top of the signed note in blue.  Flow sheet content below:  Pregnancy Intention Screening Does the patient want to become pregnant in the next year?: No Does the patient's partner want to become pregnant in the next year?: No Would the patient like to discuss contraceptive options today?: No Reason For STD Screen STD Screening: Has symptoms Have you ever had an STD?: Yes STD Symptoms Genital Itching: Yes Rash: Yes Risk Factors for Hep B Household, sexual, or needle sharing contact of a person infected with Hep B:  No Sexual contact with a person who uses drugs not as prescribed?: No Currently or Ever used drugs not as prescribed: No HIV Positive: No PRep Patient: No Men who have sex with men: No Have Hepatitis C: No History of Incarceration: No History of Homeslessness?: No Anal sex following anal drug use?: No Risk Factors for Hep C Currently using drugs not as prescribed: No Sexual partner(s) currently using drugs as not prescribed: No History of drug use: No HIV Positive: No People with a history of incarceration: No People born between the years of 69 and 64: No Counseling Patient counseled to use condoms with all sex: Condoms declined RTC in 2-3 weeks for test results: Yes Clinic will call if test results abnormal before test result appt.: Yes Test results given to patient Patient counseled to use condoms with all sex: Condoms declined   Screening for MPX risk:  Unexplained rash?  No   MSM?  No   Multiple or anonymous sex partners?  No   Any close or sexual contact with a person  diagnosed with MPX?  No   Any outside the [place]  where MPX is endemic?  No   High clinical suspicion for MPX?    -Unlikely to be chickenpox    -Lymphadenopathy    -Rash that presents in same phase of       evolution on any given body part  No   Does this patient meet CDC recommendations for vaccination against MPOX? No  You already have or anticipate having the following risks:  Your sex partner has the following risks: You're traveling to a county with a  clade I MPOX outbreak and anticipate these risks: Occupational exposure  You had known or suspected exposure to someone with monkeypox You had a sex partner in the past 2 weeks who was diagnosed with monkeypox You are a gay, bisexual, or other man who has sex with men, or are transgender or nonbinary and in the past 6 months have had any of the following: - A new diagnosis of one or more sexually transmitted diseases (e.g., chlamydia, gonorrhea, or  syphilis) - More than one sex partner You have had any of the following in the past 6 months: - Sex at a commercial sex venue (like a sex club or bathhouse) - Sex related to a large commercial event   or in a geographic area (city or county for example) where mpox virus transmission is occurring Sex with a new partner Sex at a commercial sex venue (e.g., a sex club or bathhouse) Sex in it consultant for money, goods, drugs, or other trade Sex in association with a large public event (e.g., a rave, party, or festival) i.e. certain people who work in a laboratory or healthcare facility   Infectious disease screenings: Vaccinated against HPV? Unknown  HIV Ever had a positive? No Last test: May Results in chart:  Lab Results  Component Value Date   HMHIVSCREEN Negative - Validated 01/27/2024   No results found for: HIV   Hep B Hep B status: unknown or no prior testing Received HBV vaccination? Unknown Received HBV testing for immunity? Unknown Results in chart:  No components found for: HMHEPBSCREEN  Do they qualify for HBV screening today? No Criteria:  -Household, sexual or needle sharing contact with HBV -History of drug use or homelessness -HIV positive -Those with known Hep C  Hep C Hep C status: unknown or no prior testing Results in chart:  Lab Results  Component Value Date   HMHEPCSCREEN Negative-Validated 06/19/2023   No components found for: HEPC  Do they qualify for HCV screening today? No Criteria - since the last HCV result, does the patient have any of the following? - Current drug use - Have a partner with drug use - Has been incarcerated  Immunization history:  Immunization History  Administered Date(s) Administered   Tdap 09/02/2020    The following portions of the patient's history were reviewed and updated as appropriate: allergies, current medications, past medical history, past social history, past surgical history and problem list.  Substance  use screenings:  Uses tobacco products? No Uses vapes? No Uses alcohol? Yes Uses non-injectable substances that alter your mental status? No Uses non-prescribed injectable substances? No  Objective:  There were no vitals filed for this visit.  Physical Exam Vitals and nursing note reviewed. Chaperone present: declines chaperone.  Constitutional:      Appearance: Normal appearance.  HENT:     Head: Normocephalic and atraumatic.     Comments: No nits or hair loss of scalp, brows, and lashes    Mouth/Throat:     Mouth: Mucous membranes are moist.     Pharynx: Oropharynx is clear. No oropharyngeal exudate or posterior oropharyngeal erythema.  Eyes:     General:        Right eye: No discharge.        Left eye: No discharge.     Conjunctiva/sclera: Conjunctivae normal.     Right eye: Right conjunctiva is not injected.     Left eye: Left conjunctiva is not injected.  Pulmonary:     Effort: Pulmonary effort is normal.  Abdominal:     Tenderness: There is no abdominal tenderness. There is no rebound.  Genitourinary:    General: Normal vulva.     Exam position: Lithotomy position.     Pubic Area: No rash or pubic lice.      Labia:        Right: Lesion (healing errosion c/w HSV) present.        Left: No rash or lesion.      Vagina: Vaginal discharge (scant brown) present. No erythema or lesions.     Cervix: No cervical motion tenderness, discharge, lesion or erythema.     Uterus: Not enlarged and not tender.      Rectum: Normal.      Comments: pH = unknown d/t spotting Lymphadenopathy:     Cervical: No cervical adenopathy.     Upper Body:     Right upper body: No supraclavicular, axillary or epitrochlear adenopathy.     Left upper body: No supraclavicular, axillary or epitrochlear adenopathy.     Lower Body: No right inguinal adenopathy. No left inguinal adenopathy.  Skin:    General: Skin is warm and dry.     Findings: No lesion or rash.  Neurological:     Mental Status:  She is alert and oriented to person, place, and time.  Psychiatric:        Mood and Affect: Mood normal.        Behavior: Behavior normal.     Assessment and Plan:  Alejandra Coleman is a 43 y.o. female presenting to the [facility] Department for STI screening.  Patient accepted the following screenings: oral GC culture, vaginal CT/GC swab, vaginal wet prep, HIV, and RPR  1. Screening for venereal disease (Primary) Discussed that lesion looks consistent with HSV and given episodic treatment today. - Chlamydia/Gonorrhea [facility] - WET PREP FOR TRICH, YEAST, CLUE - Syphilis Serology, [place] [facility] - HIV [place] [facility] - Gonococcus culture  2. HSV infection The patient was dispensed Acyclovir  today. I provided counseling today regarding the medication. We discussed the medication, the side effects and when to call clinic. Patient given the opportunity to ask questions. Questions answered.   - acyclovir  (ZOVIRAX ) 400 MG tablet; Take 1 tablet (400 mg total) by mouth in the morning, at noon, and at bedtime for 10 days.    Counseling: Discussed time line for [facility] results and that patient will be called with positive results and encouraged patient to call if they had not heard in 2 weeks.  Counseled to return or seek care for continued or worsening symptoms Recommended repeat testing in 3 months with positive results. Recommended condom use with all sex for STI prevention.   Due to language barrier, a Spanish interpreter ([employer] [place] [phone]) was present by phone during the history-taking, subsequent discussion, and physical exam with this patient.    Return if symptoms worsen or fail to improve.  No future appointments.  Zayne Marovich K Kemyah Buser, NP

## 2024-08-23 ENCOUNTER — Ambulatory Visit: Payer: Self-pay | Admitting: Family Medicine

## 2024-08-23 NOTE — Progress Notes (Signed)
 Results of wet prep already reviewed with patient in clinic at time of visit. No further action needed.   Dorothyann Helling, MD 08/23/24  5:28 PM

## 2024-08-26 LAB — GONOCOCCUS CULTURE
